# Patient Record
Sex: Male | Born: 1950 | Race: White | Hispanic: No | Smoking: Never smoker
Health system: Southern US, Community
[De-identification: ages and names within clinical notes are randomized; demographics above are authoritative.]

## PROBLEM LIST (undated history)

## (undated) DIAGNOSIS — G250 Essential tremor: Secondary | ICD-10-CM

## (undated) DIAGNOSIS — Z789 Other specified health status: Secondary | ICD-10-CM

## (undated) HISTORY — PX: OTHER SURGICAL HISTORY: SHX169

---

## 2020-03-14 DIAGNOSIS — N529 Male erectile dysfunction, unspecified: Secondary | ICD-10-CM | POA: Diagnosis not present

## 2020-03-14 DIAGNOSIS — E782 Mixed hyperlipidemia: Secondary | ICD-10-CM | POA: Diagnosis not present

## 2020-03-14 DIAGNOSIS — Z79899 Other long term (current) drug therapy: Secondary | ICD-10-CM | POA: Diagnosis not present

## 2020-03-14 DIAGNOSIS — R251 Tremor, unspecified: Secondary | ICD-10-CM | POA: Diagnosis not present

## 2020-03-22 DIAGNOSIS — Z808 Family history of malignant neoplasm of other organs or systems: Secondary | ICD-10-CM | POA: Diagnosis not present

## 2020-03-22 DIAGNOSIS — N529 Male erectile dysfunction, unspecified: Secondary | ICD-10-CM | POA: Diagnosis not present

## 2020-03-22 DIAGNOSIS — Z1331 Encounter for screening for depression: Secondary | ICD-10-CM | POA: Diagnosis not present

## 2020-03-22 DIAGNOSIS — B351 Tinea unguium: Secondary | ICD-10-CM | POA: Diagnosis not present

## 2020-03-22 DIAGNOSIS — Z6830 Body mass index (BMI) 30.0-30.9, adult: Secondary | ICD-10-CM | POA: Diagnosis not present

## 2020-03-22 DIAGNOSIS — Z1211 Encounter for screening for malignant neoplasm of colon: Secondary | ICD-10-CM | POA: Diagnosis not present

## 2020-03-22 DIAGNOSIS — Z0000 Encounter for general adult medical examination without abnormal findings: Secondary | ICD-10-CM | POA: Diagnosis not present

## 2020-03-22 DIAGNOSIS — E782 Mixed hyperlipidemia: Secondary | ICD-10-CM | POA: Diagnosis not present

## 2020-03-22 DIAGNOSIS — R251 Tremor, unspecified: Secondary | ICD-10-CM | POA: Diagnosis not present

## 2020-04-15 DIAGNOSIS — J019 Acute sinusitis, unspecified: Secondary | ICD-10-CM | POA: Diagnosis not present

## 2020-04-15 DIAGNOSIS — B9689 Other specified bacterial agents as the cause of diseases classified elsewhere: Secondary | ICD-10-CM | POA: Diagnosis not present

## 2020-04-15 DIAGNOSIS — J029 Acute pharyngitis, unspecified: Secondary | ICD-10-CM | POA: Diagnosis not present

## 2020-04-15 DIAGNOSIS — Z03818 Encounter for observation for suspected exposure to other biological agents ruled out: Secondary | ICD-10-CM | POA: Diagnosis not present

## 2020-04-21 DIAGNOSIS — D2339 Other benign neoplasm of skin of other parts of face: Secondary | ICD-10-CM | POA: Diagnosis not present

## 2020-04-21 DIAGNOSIS — L821 Other seborrheic keratosis: Secondary | ICD-10-CM | POA: Diagnosis not present

## 2020-04-21 DIAGNOSIS — B351 Tinea unguium: Secondary | ICD-10-CM | POA: Diagnosis not present

## 2020-04-21 DIAGNOSIS — B353 Tinea pedis: Secondary | ICD-10-CM | POA: Diagnosis not present

## 2020-04-25 DIAGNOSIS — R05 Cough: Secondary | ICD-10-CM | POA: Diagnosis not present

## 2020-05-05 ENCOUNTER — Emergency Department: Payer: Medicare HMO

## 2020-05-05 ENCOUNTER — Inpatient Hospital Stay: Payer: Medicare HMO

## 2020-05-05 ENCOUNTER — Encounter: Payer: Self-pay | Admitting: Emergency Medicine

## 2020-05-05 ENCOUNTER — Inpatient Hospital Stay
Admission: EM | Admit: 2020-05-05 | Discharge: 2020-05-11 | DRG: 987 | Disposition: A | Discharge status: Home Health | Payer: Medicare HMO | Attending: Family Medicine | Admitting: Family Medicine

## 2020-05-05 ENCOUNTER — Other Ambulatory Visit: Payer: Self-pay

## 2020-05-05 DIAGNOSIS — S5011XA Contusion of right forearm, initial encounter: Secondary | ICD-10-CM | POA: Diagnosis not present

## 2020-05-05 DIAGNOSIS — R0902 Hypoxemia: Secondary | ICD-10-CM | POA: Diagnosis not present

## 2020-05-05 DIAGNOSIS — G250 Essential tremor: Secondary | ICD-10-CM | POA: Diagnosis present

## 2020-05-05 DIAGNOSIS — W1839XA Other fall on same level, initial encounter: Secondary | ICD-10-CM | POA: Diagnosis present

## 2020-05-05 DIAGNOSIS — R55 Syncope and collapse: Secondary | ICD-10-CM

## 2020-05-05 DIAGNOSIS — R001 Bradycardia, unspecified: Secondary | ICD-10-CM | POA: Diagnosis not present

## 2020-05-05 DIAGNOSIS — Z23 Encounter for immunization: Secondary | ICD-10-CM

## 2020-05-05 DIAGNOSIS — W19XXXA Unspecified fall, initial encounter: Secondary | ICD-10-CM | POA: Diagnosis not present

## 2020-05-05 DIAGNOSIS — R7989 Other specified abnormal findings of blood chemistry: Secondary | ICD-10-CM

## 2020-05-05 DIAGNOSIS — R531 Weakness: Secondary | ICD-10-CM | POA: Diagnosis not present

## 2020-05-05 DIAGNOSIS — J189 Pneumonia, unspecified organism: Secondary | ICD-10-CM | POA: Diagnosis not present

## 2020-05-05 DIAGNOSIS — J984 Other disorders of lung: Secondary | ICD-10-CM | POA: Diagnosis not present

## 2020-05-05 DIAGNOSIS — U071 COVID-19: Principal | ICD-10-CM

## 2020-05-05 DIAGNOSIS — Z801 Family history of malignant neoplasm of trachea, bronchus and lung: Secondary | ICD-10-CM | POA: Diagnosis not present

## 2020-05-05 DIAGNOSIS — Z043 Encounter for examination and observation following other accident: Secondary | ICD-10-CM | POA: Diagnosis not present

## 2020-05-05 DIAGNOSIS — S51812A Laceration without foreign body of left forearm, initial encounter: Secondary | ICD-10-CM

## 2020-05-05 DIAGNOSIS — Y92012 Bathroom of single-family (private) house as the place of occurrence of the external cause: Secondary | ICD-10-CM | POA: Diagnosis not present

## 2020-05-05 DIAGNOSIS — Y9389 Activity, other specified: Secondary | ICD-10-CM | POA: Diagnosis not present

## 2020-05-05 DIAGNOSIS — J9601 Acute respiratory failure with hypoxia: Secondary | ICD-10-CM | POA: Diagnosis not present

## 2020-05-05 DIAGNOSIS — R5383 Other fatigue: Secondary | ICD-10-CM | POA: Diagnosis not present

## 2020-05-05 DIAGNOSIS — J1282 Pneumonia due to coronavirus disease 2019: Secondary | ICD-10-CM | POA: Diagnosis present

## 2020-05-05 DIAGNOSIS — S41012A Laceration without foreign body of left shoulder, initial encounter: Secondary | ICD-10-CM | POA: Diagnosis not present

## 2020-05-05 DIAGNOSIS — R059 Cough, unspecified: Secondary | ICD-10-CM | POA: Diagnosis not present

## 2020-05-05 HISTORY — DX: Other specified health status: Z78.9

## 2020-05-05 HISTORY — DX: Essential tremor: G25.0

## 2020-05-05 LAB — BRAIN NATRIURETIC PEPTIDE: B Natriuretic Peptide: 92 pg/mL (ref 0.0–100.0)

## 2020-05-05 LAB — CBC
HCT: 47.7 % (ref 39.0–52.0)
Hemoglobin: 15.9 g/dL (ref 13.0–17.0)
MCH: 30.4 pg (ref 26.0–34.0)
MCHC: 33.3 g/dL (ref 30.0–36.0)
MCV: 91.2 fL (ref 80.0–100.0)
Platelets: 211 10*3/uL (ref 150–400)
RBC: 5.23 MIL/uL (ref 4.22–5.81)
RDW: 12.2 % (ref 11.5–15.5)
WBC: 9.7 10*3/uL (ref 4.0–10.5)
nRBC: 0 % (ref 0.0–0.2)

## 2020-05-05 LAB — LACTATE DEHYDROGENASE: LDH: 312 U/L — ABNORMAL HIGH (ref 98–192)

## 2020-05-05 LAB — RESPIRATORY PANEL BY RT PCR (FLU A&B, COVID)
Influenza A by PCR: NEGATIVE
Influenza B by PCR: NEGATIVE
SARS Coronavirus 2 by RT PCR: POSITIVE — AB

## 2020-05-05 LAB — COMPREHENSIVE METABOLIC PANEL
ALT: 40 U/L (ref 0–44)
AST: 46 U/L — ABNORMAL HIGH (ref 15–41)
Albumin: 3.2 g/dL — ABNORMAL LOW (ref 3.5–5.0)
Alkaline Phosphatase: 49 U/L (ref 38–126)
Anion gap: 13 (ref 5–15)
BUN: 28 mg/dL — ABNORMAL HIGH (ref 8–23)
CO2: 24 mmol/L (ref 22–32)
Calcium: 8.5 mg/dL — ABNORMAL LOW (ref 8.9–10.3)
Chloride: 98 mmol/L (ref 98–111)
Creatinine, Ser: 1.07 mg/dL (ref 0.61–1.24)
GFR calc non Af Amer: >60 mL/min (ref >60)
Glucose, Bld: 114 mg/dL — ABNORMAL HIGH (ref 70–99)
Potassium: 3.9 mmol/L (ref 3.5–5.1)
Sodium: 135 mmol/L (ref 135–145)
Total Bilirubin: 1.4 mg/dL — ABNORMAL HIGH (ref 0.3–1.2)
Total Protein: 7.1 g/dL (ref 6.5–8.1)

## 2020-05-05 LAB — FIBRIN DERIVATIVES D-DIMER (ARMC ONLY): Fibrin derivatives D-dimer (ARMC): 3204.94 ng/mL (FEU) — ABNORMAL HIGH (ref 0.00–499.00)

## 2020-05-05 LAB — PROCALCITONIN: Procalcitonin: 0.17 ng/mL

## 2020-05-05 LAB — FIBRINOGEN: Fibrinogen: >750 mg/dL — ABNORMAL HIGH (ref 210–475)

## 2020-05-05 LAB — HIV ANTIBODY (ROUTINE TESTING W REFLEX): HIV Screen 4th Generation wRfx: NONREACTIVE

## 2020-05-05 LAB — TROPONIN I (HIGH SENSITIVITY): Troponin I (High Sensitivity): 8 ng/L (ref <18)

## 2020-05-05 LAB — MAGNESIUM: Magnesium: 2.7 mg/dL — ABNORMAL HIGH (ref 1.7–2.4)

## 2020-05-05 LAB — C-REACTIVE PROTEIN: CRP: 12.8 mg/dL — ABNORMAL HIGH (ref <1.0)

## 2020-05-05 LAB — FERRITIN: Ferritin: 2086 ng/mL — ABNORMAL HIGH (ref 24–336)

## 2020-05-05 LAB — HEPATITIS B SURFACE ANTIGEN: Hepatitis B Surface Ag: NONREACTIVE

## 2020-05-05 LAB — TRIGLYCERIDES: Triglycerides: 95 mg/dL (ref <150)

## 2020-05-05 MED ORDER — ONDANSETRON HCL 4 MG/2ML IJ SOLN: 4.0000 mg | Freq: Once | INTRAMUSCULAR | Status: DC

## 2020-05-05 MED ORDER — LOPERAMIDE HCL 2 MG PO CAPS
2.0000 mg | ORAL_CAPSULE | Freq: Two times a day (BID) | ORAL | Status: DC | PRN
  Filled 2020-05-05: qty 1

## 2020-05-05 MED ORDER — SODIUM CHLORIDE 0.9 % IV SOLN
200.0000 mg | Freq: Once | INTRAVENOUS | Status: AC
  Administered 2020-05-05: 14:00:00 200 mg via INTRAVENOUS
  Filled 2020-05-05: qty 200

## 2020-05-05 MED ORDER — IPRATROPIUM BROMIDE HFA 17 MCG/ACT IN AERS
2.0000 | INHALATION_SPRAY | RESPIRATORY_TRACT | Status: DC
  Administered 2020-05-05 – 2020-05-11 (×34): 2 via RESPIRATORY_TRACT
  Filled 2020-05-05 (×3): qty 12.9

## 2020-05-05 MED ORDER — ALBUTEROL SULFATE HFA 108 (90 BASE) MCG/ACT IN AERS
2.0000 | INHALATION_SPRAY | RESPIRATORY_TRACT | Status: DC | PRN
  Filled 2020-05-05: qty 6.7

## 2020-05-05 MED ORDER — DEXAMETHASONE SODIUM PHOSPHATE 10 MG/ML IJ SOLN
6.0000 mg | Freq: Once | INTRAMUSCULAR | Status: AC
  Administered 2020-05-05: 6 mg via INTRAVENOUS
  Filled 2020-05-05: qty 1

## 2020-05-05 MED ORDER — IOHEXOL 350 MG/ML SOLN
75.0000 mL | Freq: Once | INTRAVENOUS | Status: AC | PRN
  Administered 2020-05-05: 13:00:00 75 mL via INTRAVENOUS

## 2020-05-05 MED ORDER — TETANUS-DIPHTH-ACELL PERTUSSIS 5-2.5-18.5 LF-MCG/0.5 IM SUSP
0.5000 mL | Freq: Once | INTRAMUSCULAR | Status: AC
  Administered 2020-05-05: 0.5 mL via INTRAMUSCULAR
  Filled 2020-05-05: qty 0.5

## 2020-05-05 MED ORDER — HYDROMORPHONE HCL 1 MG/ML IJ SOLN: 0.5000 mg | Freq: Once | INTRAMUSCULAR | Status: DC

## 2020-05-05 MED ORDER — ENOXAPARIN SODIUM 40 MG/0.4ML ~~LOC~~ SOLN
40.0000 mg | SUBCUTANEOUS | Status: DC
  Administered 2020-05-05 – 2020-05-10 (×6): 40 mg via SUBCUTANEOUS
  Filled 2020-05-05 (×6): qty 0.4

## 2020-05-05 MED ORDER — SODIUM CHLORIDE 0.9 % IV SOLN
100.0000 mg | Freq: Every day | INTRAVENOUS | Status: AC
  Administered 2020-05-06 – 2020-05-09 (×4): 100 mg via INTRAVENOUS
  Filled 2020-05-05 (×4): qty 20

## 2020-05-05 MED ORDER — DM-GUAIFENESIN ER 30-600 MG PO TB12
1.0000 | ORAL_TABLET | Freq: Two times a day (BID) | ORAL | Status: DC | PRN
  Administered 2020-05-07: 1 via ORAL
  Filled 2020-05-05: qty 1

## 2020-05-05 MED ORDER — ASCORBIC ACID 500 MG PO TABS
500.0000 mg | ORAL_TABLET | Freq: Every day | ORAL | Status: DC
  Administered 2020-05-05 – 2020-05-11 (×7): 500 mg via ORAL
  Filled 2020-05-05 (×7): qty 1

## 2020-05-05 MED ORDER — ONDANSETRON HCL 4 MG PO TABS: 4.0000 mg | ORAL_TABLET | Freq: Four times a day (QID) | ORAL | Status: DC | PRN

## 2020-05-05 MED ORDER — LACTATED RINGERS IV BOLUS: 1000.0000 mL | Freq: Once | INTRAVENOUS | Status: DC

## 2020-05-05 MED ORDER — ACETAMINOPHEN 325 MG PO TABS: 650.0000 mg | ORAL_TABLET | Freq: Four times a day (QID) | ORAL | Status: DC | PRN

## 2020-05-05 MED ORDER — METHYLPREDNISOLONE SODIUM SUCC 40 MG IJ SOLR
40.0000 mg | Freq: Two times a day (BID) | INTRAMUSCULAR | Status: DC
  Administered 2020-05-05 – 2020-05-11 (×12): 40 mg via INTRAVENOUS
  Filled 2020-05-05 (×12): qty 1

## 2020-05-05 MED ORDER — PROPRANOLOL HCL ER 60 MG PO CP24
60.0000 mg | ORAL_CAPSULE | Freq: Every morning | ORAL | Status: DC
  Administered 2020-05-06 – 2020-05-11 (×4): 60 mg via ORAL
  Filled 2020-05-05 (×7): qty 1

## 2020-05-05 MED ORDER — ZINC SULFATE 220 (50 ZN) MG PO CAPS
220.0000 mg | ORAL_CAPSULE | Freq: Every day | ORAL | Status: DC
  Administered 2020-05-05 – 2020-05-11 (×7): 220 mg via ORAL
  Filled 2020-05-05 (×7): qty 1

## 2020-05-05 MED ORDER — LIDOCAINE-EPINEPHRINE 2 %-1:100000 IJ SOLN
20.0000 mL | Freq: Once | INTRAMUSCULAR | Status: AC
  Administered 2020-05-05: 20 mL via INTRADERMAL
  Filled 2020-05-05: qty 1

## 2020-05-05 NOTE — ED Notes (Signed)
Pt transported to CT ?

## 2020-05-05 NOTE — Progress Notes (Signed)
Remdesivir - Pharmacy Brief Note   O:  ALT: 40 CXR: Patchy bilateral basilar predominant airspace disease compatible with multifocal pneumonia. SpO2: 93-98% on 4 L   A/P:  Remdesivir 200 mg IVPB once followed by 100 mg IVPB daily x 4 days.   Pernell Dupre, PharmD, BCPS Clinical Pharmacist 05/05/2020 11:46 AM

## 2020-05-05 NOTE — Progress Notes (Signed)
Orthostatic VS:  Lying: 154/95, pulse 69 Sitting: 247/92, pulse 75 Standing: 149/133, pulse 80

## 2020-05-05 NOTE — Progress Notes (Signed)
Ultrasound at bedside

## 2020-05-05 NOTE — ED Provider Notes (Signed)
[facility] Emergency Department Provider Note  ____________________________________________   First MD Initiated Contact with Patient 05/05/20 1023     (approximate)  I have reviewed the triage vital signs and the nursing notes.   HISTORY  Chief Complaint Fatigue and Loss of Consciousness   HPI Joseph Parsons is a 69 y.o. male without significant past medical history presents from home for assessment after a syncopal episode.  Patient notes he was diagnosed with pneumonia 3-4 weeks ago and finished antibiotics but still has a productive cough and was recently diagnosed with Covid 1 week ago.  Patient endorses significant fatigue and some intermittent diarrhea as well as a syncopal episode yesterday during which she stated he fell forward striking his toilet with both arms.  He does not think he hit his head and does not have a headache, neck pain, back pain, dental pain, chest pain, or extremity pain with exception of his bilateral forearms.  He notes he has a cut on his left forearm and some bruising on his right forearm.  He has not syncopized today.  He denies being on blood thinners.  He denies any significant past medical history including tobacco abuse, EtOH use, illicit drug use.         Past Medical History:  Diagnosis Date  . Patient denies medical problems     Patient Active Problem List   Diagnosis Date Noted  . Pneumonia due to COVID-19 virus 05/05/2020  . Syncope 05/05/2020  . Fall 05/05/2020  . Acute respiratory failure with hypoxia ([place]) 05/05/2020    History reviewed. No pertinent surgical history.  Prior to Admission medications   Medication Sig Start Date End Date Taking? Authorizing Provider  propranolol ER (INDERAL LA) 60 MG 24 hr capsule Take 60 mg by mouth every morning. 04/21/20  Yes [provider]  albuterol (VENTOLIN HFA) 108 (90 Base) MCG/ACT inhaler Inhale 2 puffs into the lungs every 4 (four) hours as needed.  04/25/20   [provider]    Allergies Patient has no known allergies.  History reviewed. No pertinent family history.  Social History Social History   Tobacco Use  . Smoking status: Never Smoker  . Smokeless tobacco: Never Used  Substance Use Topics  . Alcohol use: Yes  . Drug use: Never    Review of Systems  Review of Systems  Constitutional: Positive for malaise/fatigue. Negative for chills and fever.  HENT: Negative for sore throat.   Eyes: Negative for pain.  Respiratory: Positive for cough and shortness of breath. Negative for stridor.   Cardiovascular: Negative for chest pain.  Gastrointestinal: Negative for vomiting.  Skin: Negative for rash.  Neurological: Positive for loss of consciousness. Negative for seizures and headaches.  Psychiatric/Behavioral: Negative for suicidal ideas.  All other systems reviewed and are negative.     ____________________________________________   PHYSICAL EXAM:  VITAL SIGNS: ED Triage Vitals  Enc Vitals Group     BP 05/05/20 1008 116/74     Pulse Rate 05/05/20 1008 81     Resp 05/05/20 1008 (!) 22     Temp 05/05/20 1008 98.5 F (36.9 C)     Temp Source 05/05/20 1008 Oral     SpO2 05/05/20 1008 (!) 85 %     Weight 05/05/20 1010 190 lb (86.2 kg)     Height 05/05/20 1010 5\' 10"  (1.778 m)     Head Circumference --      Peak Flow --  Pain Score 05/05/20 1009 0     Pain Loc --      Pain Edu? --      Excl. in [place]? --    Vitals:   05/05/20 1148 05/05/20 1156  BP: 132/79   Pulse: 67   Resp: (!) 22   Temp:    SpO2: 98% 96%   Physical Exam Vitals and nursing note reviewed.  Constitutional:      Appearance: He is well-developed.  HENT:     Head: Normocephalic and atraumatic.     Right Ear: External ear normal.     Left Ear: External ear normal.     Nose: Nose normal.  Eyes:     Conjunctiva/sclera: Conjunctivae normal.  Cardiovascular:     Rate and Rhythm: Normal rate and regular rhythm.     Heart  sounds: No murmur heard.   Pulmonary:     Effort: Pulmonary effort is normal. Tachypnea present. No respiratory distress.     Breath sounds: Normal breath sounds.  Abdominal:     Palpations: Abdomen is soft.     Tenderness: There is no abdominal tenderness.  Musculoskeletal:     Cervical back: Neck supple.  Skin:    General: Skin is warm and dry.  Neurological:     Mental Status: He is alert and oriented to person, place, and time.  Psychiatric:        Mood and Affect: Mood normal.     Patient does have approximately 1 cm circular hemostatic linear laceration over his left forearm.  2+ bilateral radial pulses.  There are some ecchymosis over the bilateral anterior forearms.  Sensation is intact in the distribution of the radial ulnar and median nerves bilaterally.  Patient has full strength range of motion and no tenderness over the bilateral wrist elbow and shoulder joints.  No other obvious trauma to the upper extremities.  No tenderness over the C/T/L-spine. ____________________________________________   LABS (all labs ordered are listed, but only abnormal results are displayed)  Labs Reviewed  RESPIRATORY PANEL BY RT PCR (FLU A&B, COVID) - Abnormal; Notable for the following components:      Result Value   SARS Coronavirus 2 by RT PCR POSITIVE (*)    All other components within normal limits  COMPREHENSIVE METABOLIC PANEL - Abnormal; Notable for the following components:   Glucose, Bld 114 (*)    BUN 28 (*)    Calcium 8.5 (*)    Albumin 3.2 (*)    AST 46 (*)    Total Bilirubin 1.4 (*)    All other components within normal limits  FIBRIN DERIVATIVES D-DIMER ([facility] ONLY) - Abnormal; Notable for the following components:   Fibrin derivatives D-dimer ([facility]) 3,204.94 (*)    All other components within normal limits  MAGNESIUM - Abnormal; Notable for the following components:   Magnesium 2.7 (*)    All other components within normal limits  CBC  BRAIN NATRIURETIC PEPTIDE   URINALYSIS, COMPLETE (UACMP) WITH MICROSCOPIC  PROCALCITONIN  CBG MONITORING, ED  TROPONIN I (HIGH SENSITIVITY)  TROPONIN I (HIGH SENSITIVITY)   ____________________________________________  EKG  Sinus rhythm with a ventricular rate of 85, normal axis, unremarkable intervals, nonspecific ST change in lead III and no other clear evidence of acute ischemia. ____________________________________________  RADIOLOGY  ED MD interpretation: Bilateral infiltrates consistent with multifocal pneumonia.  No significant edema or effusion or pneumothorax.  Official radiology report(s): DG Chest 2 View  Result Date: 05/05/2020 CLINICAL DATA:  Productive cough. EXAM: CHEST -  2 VIEW COMPARISON:  [facility]. FINDINGS: Interval development of patchy basilar predominant airspace disease, left greater than right. No pleural effusion. Cardiopericardial silhouette is at upper limits of normal for size. The visualized bony structures of the thorax show no acute abnormality. IMPRESSION: Patchy bilateral basilar predominant airspace disease compatible with multifocal pneumonia. Electronically Signed   By: Misty Stanley M.D.   On: 05/05/2020 10:46    ____________________________________________   PROCEDURES  Procedure(s) performed (including Critical Care):  Marland KitchenMarland KitchenLaceration Repair  Date/Time: 05/05/2020 12:15 PM Performed by: Lucrezia Starch, MD Authorized by: Lucrezia Starch, MD   Consent:    Consent obtained:  Verbal   Consent given by:  Patient   Risks discussed:  Pain, need for additional repair and poor wound healing   Alternatives discussed:  No treatment Anesthesia (see MAR for exact dosages):    Anesthesia method:  Local infiltration   Local anesthetic:  Lidocaine 2% WITH epi Laceration details:    Location:  Shoulder/arm   Shoulder/arm location:  L lower arm   Length (cm):  1 Repair type:    Repair type:  Intermediate Exploration:     Contaminated: no   Treatment:    Area cleansed with:  Saline   Amount of cleaning:  Standard   Irrigation solution:  Sterile water   Irrigation method:  Syringe Skin repair:    Repair method:  Sutures   Suture size:  6-0   Suture material:  Prolene   Number of sutures:  6 Approximation:    Approximation:  Close Post-procedure details:    Patient tolerance of procedure:  Tolerated well, no immediate complications .Critical Care Performed by: Lucrezia Starch, MD Authorized by: Lucrezia Starch, MD   Critical care provider statement:    Critical care time (minutes):  45   Critical care was necessary to treat or prevent imminent or life-threatening deterioration of the following conditions:  Respiratory failure   Critical care was time spent personally by me on the following activities:  Discussions with consultants, evaluation of patient's response to treatment, examination of patient, ordering and performing treatments and interventions, ordering and review of laboratory studies, ordering and review of radiographic studies, pulse oximetry, re-evaluation of patient's condition, obtaining history from patient or surrogate and review of old charts     ____________________________________________   INITIAL IMPRESSION / [place] / ED COURSE        Patient presents with [place] to history exam for assessment of worsening shortness of breath, cough, and a syncopal episode that occurred in the setting of recent COVID-19 diagnosis.  Patient is tachypneic with otherwise stable vital signs on 3 L.  On room air patient's SPO2 is noted to be 85%.  Patient did sustain a laceration to his left forearm when he syncopized yesterday.  This was repaired per procedure note above.  Otherwise impression is contusion and bruising with low suspicion for fracture patient is neurovascular intact distal bilateral upper extremities.  Covid test is positive.  Given elevated dimer will obtain CTA chest  to assess for evidence of PE.  Low suspicion for ACS or myocarditis given reassuring EKG and patient denying any history of chest pain and a troponin of 8.  CMP shows no significant electrolyte or metabolic derangements.  CBC is unremarkable.  BNP is 92 and patient has no findings on exam or chest x-ray to suggest volume overload contributing to his respiratory distress.  I suspect patient's syncopal episode yesterday is  a combination of hypoxic respiratory failure as well as mild dehydration from recent diarrhea.  Decadron and remdesivir ordered.  I will admit to hospital service for further evaluation and management.  ____________________________________________   FINAL CLINICAL IMPRESSION(S) / ED DIAGNOSES  Final diagnoses:  Acute respiratory failure with hypoxia (HCC)  COVID-19  Forearm laceration, left, initial encounter  Syncope and collapse  Positive D dimer    Medications  remdesivir 200 mg in sodium chloride 0.9% 250 mL IVPB (has no administration in time range)    Followed by  remdesivir 100 mg in sodium chloride 0.9 % 100 mL IVPB (has no administration in time range)  methylPREDNISolone sodium succinate (SOLU-MEDROL) 40 mg/mL injection 40 mg (has no administration in time range)  ascorbic acid (VITAMIN C) tablet 500 mg (has no administration in time range)  zinc sulfate capsule 220 mg (has no administration in time range)  ipratropium (ATROVENT HFA) inhaler 2 puff (has no administration in time range)  albuterol (VENTOLIN HFA) 108 (90 Base) MCG/ACT inhaler 2 puff (has no administration in time range)  dextromethorphan-guaiFENesin (MUCINEX DM) 30-600 MG per 12 hr tablet 1 tablet (has no administration in time range)  ondansetron (ZOFRAN) tablet 4 mg (has no administration in time range)  acetaminophen (TYLENOL) tablet 650 mg (has no administration in time range)  Tdap (BOOSTRIX) injection 0.5 mL (0.5 mLs Intramuscular Given 05/05/20 1111)  lidocaine-EPINEPHrine (XYLOCAINE  W/EPI) 2 %-1:100000 (with pres) injection 20 mL (20 mLs Intradermal Given 05/05/20 1111)  dexamethasone (DECADRON) injection 6 mg (6 mg Intravenous Given 05/05/20 1144)     ED Discharge Orders    None       Note:  This document was prepared using Dragon voice recognition software and may include unintentional dictation errors.   Lucrezia Starch, MD 05/05/20 [phone]

## 2020-05-05 NOTE — ED Triage Notes (Signed)
Pt here for weakness and fatigue.  Has had productive cough. Had PNA in September and finished abx but did not feel better so went to walk in clinic one week ago and dx with covid.  Unlabored but mild tachypnea. Hypoxia in triage.

## 2020-05-05 NOTE — ED Notes (Signed)
Pt transported to CT at this time.

## 2020-05-05 NOTE — ED Notes (Signed)
Date and time results received: 05/05/20 12:07  Test: COVID Critical Value: positive result  Name of Provider Notified: Creig Hines, MD  Orders Received? Or Actions Taken?: Orders placed accordingly to results.

## 2020-05-05 NOTE — ED Triage Notes (Signed)
Pt in via EMS from home with c/o syncopal episode. Pt reports tested positive also. Pt fell and hit forearm on his forearm.  Pt was dx'd with pneumonia 2 weeks ago. Pt reports sx's no better. Pt with productive cough.

## 2020-05-05 NOTE — H&P (Signed)
History and Physical    Joseph Parsons XKG:[id number] DOB: 06-Aug-1950 DOA: 05/05/2020  Referring MD/NP/PA:   PCP: [facility]   Patient coming from:  The patient is coming from home.  At baseline, pt is independent for most of ADL.        Chief Complaint: Cough, shortness breath, syncope  HPI: Joseph Parsons is a 69 y.o. male with medical history significant of essential tremor, who presents with cough, shortness breath, syncope.  Pt states that he has been having cough and shortness breath for several weeks.  He was recently diagnosed with pneumonia 3 weeks ago and finished a course of antibiotics.  He states that he continues to have cough and shortness of breath.  He was tested positive for Covid19 one week ago.  Patient denies fever or chills.  No chest pain.  Patient has intermittent diarrhea, denies nausea, vomiting, abdominal pain.  No symptoms of UTI.  Patient states that he was fully vaccinated against COVID-19. Patient states that he has been feeling weak. He has lack of energy and poor appetite. Patient states that he passed out yesterday. He states that he fell forward striking his toilet with both arms. He notes bruising on his right forearm. Patient denies any head or neck injury.  ED Course: pt was found to have positive Covid PCR, D-dimer 3204, WBC 9.7, BNP 92, electrolytes renal function okay, troponin level 8, temperature normal, blood pressure 116/74, heart rate 81, RR 22, oxygen desaturated to 85% on room air, which improved to 93 on 3 L oxygen.  Chest x-ray showed bilateral patchy infiltration. CT angiogram negative for PE.  Lower extremity Doppler negative for DVT. Patient is admitted to [place] bed as inpatient.   Review of Systems:   General: no fevers, chills, no body weight gain, has poor appetite, has fatigue HEENT: no blurry vision, hearing changes or sore throat Respiratory: has dyspnea, coughing, no wheezing CV: no chest pain, no  palpitations GI: no nausea, vomiting, abdominal pain, has diarrhea, no constipation GU: no dysuria, burning on urination, increased urinary frequency, hematuria  Ext: no leg edema Neuro: no unilateral weakness, numbness, or tingling, no vision change or hearing loss. Has syncope. Skin: no rash. MSK: No muscle spasm, no deformity, no limitation of range of movement in spin Heme: No easy bruising.  Travel history: No recent long distant travel.  Allergy: No Known Allergies  Past Medical History:  Diagnosis Date  . Essential tremor   . Patient denies medical problems     Past Surgical History:  Procedure Laterality Date  . right knee surgery      Social History:  reports that he has never smoked. He has never used smokeless tobacco. He reports current alcohol use. He reports that he does not use drugs.  Family History:  Family History  Problem Relation Age of Onset  . Lung cancer Sister      Prior to Admission medications   Not on File    Physical Exam: Vitals:   05/05/20 1340 05/05/20 1432 05/05/20 1440 05/05/20 1640  BP: 115/76   (!) 141/83  Pulse: 69   73  Resp: 20   18  Temp: 98.1 F (36.7 C)   98.6 F (37 C)  TempSrc: Oral   Oral  SpO2: 98% (!) 86% 94% 93%  Weight:      Height:       General: Not in acute distress HEENT:       Eyes: PERRL,  EOMI, no scleral icterus.       ENT: No discharge from the ears and nose, no pharynx injection, no tonsillar enlargement.        Neck: No JVD, no bruit, no mass felt. Heme: No neck lymph node enlargement. Cardiac: S1/S2, RRR, No murmurs, No gallops or rubs. Respiratory: Has coarse breathing sound bialterally GI: Soft, nondistended, nontender, no rebound pain, no organomegaly, BS present. GU: No hematuria Ext: No pitting leg edema bilaterally. 2+DP/PT pulse bilaterally. Musculoskeletal: No joint deformities, No joint redness or warmth, no limitation of ROM in spin. Skin: No rashes.  Neuro: Alert, answers questions  slowly, but patient is oriented X3, cranial nerves II-XII grossly intact, moves all extremities normally Psych: Patient is not psychotic, no suicidal or hemocidal ideation.  Labs on Admission: I have personally reviewed following labs and imaging studies  CBC: Recent Labs  Lab 05/05/20 1013  WBC 9.7  HGB 15.9  HCT 47.7  MCV 91.2  PLT 947   Basic Metabolic Panel: Recent Labs  Lab 05/05/20 1013 05/05/20 1021  NA 135  --   K 3.9  --   CL 98  --   CO2 24  --   GLUCOSE 114*  --   BUN 28*  --   CREATININE 1.07  --   CALCIUM 8.5*  --   MG  --  2.7*   GFR: Estimated Creatinine Clearance: 68.2 mL/min (by C-G formula based on SCr of 1.07 mg/dL). Liver Function Tests: Recent Labs  Lab 05/05/20 1013  AST 46*  ALT 40  ALKPHOS 49  BILITOT 1.4*  PROT 7.1  ALBUMIN 3.2*   No results for input(s): LIPASE, AMYLASE in the last 168 hours. No results for input(s): AMMONIA in the last 168 hours. Coagulation Profile: No results for input(s): INR, PROTIME in the last 168 hours. Cardiac Enzymes: No results for input(s): CKTOTAL, CKMB, CKMBINDEX, TROPONINI in the last 168 hours. BNP (last 3 results) No results for input(s): PROBNP in the last 8760 hours. HbA1C: No results for input(s): HGBA1C in the last 72 hours. CBG: No results for input(s): GLUCAP in the last 168 hours. Lipid Profile: Recent Labs    05/05/20 1423  TRIG 95   Thyroid Function Tests: No results for input(s): TSH, T4TOTAL, FREET4, T3FREE, THYROIDAB in the last 72 hours. Anemia Panel: Recent Labs    05/05/20 1423  FERRITIN 2,086*   Urine analysis: No results found for: COLORURINE, APPEARANCEUR, LABSPEC, PHURINE, GLUCOSEU, HGBUR, BILIRUBINUR, KETONESUR, PROTEINUR, UROBILINOGEN, NITRITE, LEUKOCYTESUR Sepsis Labs: @LABRCNTIP (procalcitonin:4,lacticidven:4) ) Recent Results (from the past 240 hour(s))  Respiratory Panel by RT PCR (Flu A&B, Covid) - Nasopharyngeal Swab     Status: Abnormal   Collection Time:  05/05/20 11:09 AM   Specimen: Nasopharyngeal Swab  Result Value Ref Range Status   SARS Coronavirus 2 by RT PCR POSITIVE (A) NEGATIVE Final    Comment: RESULT CALLED TO, READ BACK BY AND VERIFIED WITH: KENDALL Polcyn 05/05/20 1208 KLW (NOTE) SARS-CoV-2 target nucleic acids are DETECTED.  SARS-CoV-2 RNA is generally detectable in upper respiratory specimens  during the acute phase of infection. Positive results are indicative of the presence of the identified virus, but do not rule out bacterial infection or co-infection with other pathogens not detected by the test. Clinical correlation with patient history and other diagnostic information is necessary to determine patient infection status. The expected result is Negative.  Fact Sheet for Patients:  PinkCheek.be  Fact Sheet for Healthcare Providers: GravelBags.it  This test is not yet  approved or cleared by the [place] and  has been authorized for detection and/or diagnosis of SARS-CoV-2 by FDA under an Emergency Use Authorization (EUA).  This EUA will remain in effect (meaning this test can be u sed) for the duration of  the COVID-19 declaration under Section 564(b)(1) of the Act, 21 U.S.C. section 360bbb-3(b)(1), unless the authorization is terminated or revoked sooner.      Influenza A by PCR NEGATIVE NEGATIVE Final   Influenza B by PCR NEGATIVE NEGATIVE Final    Comment: (NOTE) The Xpert Xpress SARS-CoV-2/FLU/RSV assay is intended as an aid in  the diagnosis of influenza from Nasopharyngeal swab specimens and  should not be used as a sole basis for treatment. Nasal washings and  aspirates are unacceptable for Xpert Xpress SARS-CoV-2/FLU/RSV  testing.  Fact Sheet for Patients: PinkCheek.be  Fact Sheet for Healthcare Providers: GravelBags.it  This test is not yet approved or cleared by the  [place] FDA and  has been authorized for detection and/or diagnosis of SARS-CoV-2 by  FDA under an Emergency Use Authorization (EUA). This EUA will remain  in effect (meaning this test can be used) for the duration of the  Covid-19 declaration under Section 564(b)(1) of the Act, 21  U.S.C. section 360bbb-3(b)(1), unless the authorization is  terminated or revoked. Performed at [facility], [address] [place], [place] [address]      Radiological Exams on Admission: DG Chest 2 View  Result Date: 05/05/2020 CLINICAL DATA:  Productive cough. EXAM: CHEST - 2 VIEW COMPARISON:  [facility]. FINDINGS: Interval development of patchy basilar predominant airspace disease, left greater than right. No pleural effusion. Cardiopericardial silhouette is at upper limits of normal for size. The visualized bony structures of the thorax show no acute abnormality. IMPRESSION: Patchy bilateral basilar predominant airspace disease compatible with multifocal pneumonia. Electronically Signed   By: Misty Stanley M.D.   On: 05/05/2020 10:46   CT HEAD WO CONTRAST  Result Date: 05/05/2020 CLINICAL DATA:  69 year old male with weakness, fatigue, productive cough, COVID-19. EXAM: CT HEAD WITHOUT CONTRAST TECHNIQUE: Contiguous axial images were obtained from the base of the skull through the vertex without intravenous contrast. COMPARISON:  None. FINDINGS: Brain: Cerebral volume is within normal limits for age. No midline shift, mass effect, or evidence of intracranial mass lesion. No ventriculomegaly. No acute intracranial hemorrhage identified. 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 Perivascular spaces less likely. Largely normal for age gray-white matter differentiation elsewhere. No acute cortical infarct or chronic cortical encephalomalacia identified. Vascular: Mild Calcified atherosclerosis at the skull base. No suspicious  intracranial vascular hyperdensity. Skull: No acute osseous abnormality identified. Sinuses/Orbits: Small fluid level with bubbly opacity in the right maxillary sinus. Minor ethmoid mucosal thickening. Other sinuses, the tympanic cavities and mastoids are well pneumatized. Other: No acute orbit or scalp soft tissue findings. Postoperative changes to the left globe. IMPRESSION: 1. No acute intracranial abnormality. 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 2. Acute right maxillary sinus inflammation. Electronically Signed   By: Genevie Ann M.D.   On: 05/05/2020 13:28   CT Angio Chest PE W and/or Wo Contrast  Result Date: 05/05/2020 CLINICAL DATA:  69 year old male with weakness, fatigue, productive cough. Treated for pneumonia in September with antibiotics. Subsequently diagnosed with COVID-19. EXAM: CT ANGIOGRAPHY CHEST WITH CONTRAST TECHNIQUE: Multidetector CT imaging of the chest was performed using the standard protocol during bolus administration of intravenous contrast. Multiplanar CT  image reconstructions and MIPs were obtained to evaluate the vascular anatomy. CONTRAST:  59mL OMNIPAQUE IOHEXOL 350 MG/ML SOLN COMPARISON:  Chest radiographs earlier today. FINDINGS: Cardiovascular: Good contrast bolus timing in the pulmonary arterial tree. No focal filling defect identified in the pulmonary arteries to suggest acute pulmonary embolism. Cardiac size at the upper limits of normal.  Negative visible aorta. Mediastinum/Nodes: Negative; upper limits of normal right carina and subcarinal lymph nodes. Lungs/Pleura: Bilateral lower lobe consolidation involving the posterior basal segments. Additional streaky opacity in the other lower lobe segments. Peripheral and peribronchial mostly ground-glass opacity in both upper lobes. Major airways remain patent. No pleural effusion. Upper Abdomen: Negative visible liver, gallbladder, spleen, pancreas, adrenal glands, kidneys, and bowel in the upper  abdomen. Musculoskeletal: Degenerative vertebral endplate and costovertebral spurring at multiple levels. No acute or suspicious osseous lesion. Review of the MIP images confirms the above findings. IMPRESSION: 1. Negative for acute pulmonary embolus. 2. Widespread bilateral pneumonia typical in appearance for COVID-19. Some consolidation in both lower lobes. No pleural effusion. Electronically Signed   By: Genevie Ann M.D.   On: 05/05/2020 13:25   US Venous Img Lower Bilateral (DVT)  Result Date: 05/05/2020 CLINICAL DATA:  Positive D-dimer study.  Recent fall EXAM: BILATERAL LOWER EXTREMITY VENOUS DUPLEX ULTRASOUND TECHNIQUE: Gray-scale sonography with graded compression, as well as color Doppler and duplex ultrasound were performed to evaluate the lower extremity deep venous systems from the level of the common femoral vein and including the common femoral, femoral, profunda femoral, popliteal and calf veins including the posterior tibial, peroneal and gastrocnemius veins when visible. The superficial great saphenous vein was also interrogated. Spectral Doppler was utilized to evaluate flow at rest and with distal augmentation maneuvers in the common femoral, femoral and popliteal veins. COMPARISON:  None. FINDINGS: RIGHT LOWER EXTREMITY Common Femoral Vein: No evidence of thrombus. Normal compressibility, respiratory phasicity and response to augmentation. Saphenofemoral Junction: No evidence of thrombus. Normal compressibility and flow on color Doppler imaging. Profunda Femoral Vein: No evidence of thrombus. Normal compressibility and flow on color Doppler imaging. Femoral Vein: No evidence of thrombus. Normal compressibility, respiratory phasicity and response to augmentation. Popliteal Vein: No evidence of thrombus. Normal compressibility, respiratory phasicity and response to augmentation. Calf Veins: No evidence of thrombus. Normal compressibility and flow on color Doppler imaging. Superficial Great  Saphenous Vein: No evidence of thrombus. Normal compressibility. Venous Reflux:  None. Other Findings:  Blood flow has a rouleaux type pattern. LEFT LOWER EXTREMITY Common Femoral Vein: No evidence of thrombus. Normal compressibility, respiratory phasicity and response to augmentation. Saphenofemoral Junction: No evidence of thrombus. Normal compressibility and flow on color Doppler imaging. Profunda Femoral Vein: No evidence of thrombus. Normal compressibility and flow on color Doppler imaging. Femoral Vein: No evidence of thrombus. Normal compressibility, respiratory phasicity and response to augmentation. Popliteal Vein: No evidence of thrombus. Normal compressibility, respiratory phasicity and response to augmentation. Calf Veins: No evidence of thrombus. Normal compressibility and flow on color Doppler imaging. Superficial Great Saphenous Vein: No evidence of thrombus. Normal compressibility. Venous Reflux:  None. Other Findings:  Blood flow as a rouleaux type pattern. IMPRESSION: No evidence of deep venous thrombosis in either lower extremity. Blood flow has a rouleaux type pattern bilaterally suggesting slow flow. Clinical significance of this finding uncertain. Electronically Signed   By: Lowella Grip III M.D.   On: 05/05/2020 14:44     EKG: Independently reviewed.  Sinus rhythm, QTC 437, LAE, no ischemic change.  Assessment/Plan Principal Problem:  Pneumonia due to COVID-19 virus Active Problems:   Syncope and collapse   Fall   Acute respiratory failure with hypoxia (HCC)   Essential tremor  Acute respiratory failure with hypoxia due to pneumonia due to COVID-19 virus: Oxygen desaturation to 85% on room air, improved to 93 on 3 L nasal cannula oxygen.  Chest x-ray is positive for bilateral patchy infiltration.  CT angiogram negative for PE.  Lower extremity Doppler negative for DVT.  -will admit to med-surg bed as inpt -Remdesivir per pharm -Solumedrol 40 mg bid -vitamin C, zinc.   -Bronchodilators -PRN Mucinex for cough -f/u Blood culture -D-dimer, BNP,Trop, LFT, CRP, LDH, Procalcitonin, Ferritin, fibinogen, TG, Hep B SAg, HIV ab -Daily CRP, Ferritin, D-dimer, -Will ask the patient to maintain an awake prone position for 16+ hours a day, if possible, with a minimum of 2-3 hours at a time -Will attempt to maintain euvolemia to a net negative fluid status -IF patient deteriorates, will consult PCCM and ID  Syncope and collapse and fall: No focal neurologic deficit on physical examination.  CT head negative for acute intracranial abnormalities.  Patient is alert, but answers questions slowly. -pt/ot -Continue wound check -Will get MRI of the brain -check orthostatic vital sign  Essential tremor -continue inderal          DVT ppx: SQ Lovenox Code Status: Full code Family Communication: not done, no family member is at bed side.  Patient states that he does not have any family.  He lives independently and does not have family here. Disposition Plan:  Anticipate discharge back to previous environment Consults called:  none Admission status: Med-surg bed as inpt        Status is: Inpatient  Remains inpatient appropriate because:Inpatient level of care appropriate due to severity of illness.  Patient presents with acute respiratory failure with hypoxia due to pneumonia secondary to COVID-19 infection.  Patient also has syncope.  His presentation is highly complicated.  He is at high risk of deterioration.  Patient will need to be treated in hospital for at least 2 days.   Dispo: The patient is from: Home              Anticipated d/c is to: Home              Anticipated d/c date is: 2 days              Patient currently is not medically stable to d/c.           Date of Service 05/05/2020    Ivor Costa [facility]   If 7PM-7AM, please contact night-coverage [email or web address] 05/05/2020, 5:01 PM

## 2020-05-06 ENCOUNTER — Inpatient Hospital Stay: Payer: Medicare HMO

## 2020-05-06 LAB — FERRITIN: Ferritin: 1469 ng/mL — ABNORMAL HIGH (ref 24–336)

## 2020-05-06 LAB — CBC WITH DIFFERENTIAL/PLATELET
Abs Immature Granulocytes: 0.04 10*3/uL (ref 0.00–0.07)
Basophils Absolute: 0 10*3/uL (ref 0.0–0.1)
Basophils Relative: 0 %
Eosinophils Absolute: 0 10*3/uL (ref 0.0–0.5)
Eosinophils Relative: 0 %
HCT: 44.3 % (ref 39.0–52.0)
Hemoglobin: 15.1 g/dL (ref 13.0–17.0)
Immature Granulocytes: 1 %
Lymphocytes Relative: 8 %
Lymphs Abs: 0.5 10*3/uL — ABNORMAL LOW (ref 0.7–4.0)
MCH: 31.1 pg (ref 26.0–34.0)
MCHC: 34.1 g/dL (ref 30.0–36.0)
MCV: 91.3 fL (ref 80.0–100.0)
Monocytes Absolute: 0.4 10*3/uL (ref 0.1–1.0)
Monocytes Relative: 6 %
Neutro Abs: 5 10*3/uL (ref 1.7–7.7)
Neutrophils Relative %: 85 %
Platelets: 211 10*3/uL (ref 150–400)
RBC: 4.85 MIL/uL (ref 4.22–5.81)
RDW: 12.4 % (ref 11.5–15.5)
WBC: 5.8 10*3/uL (ref 4.0–10.5)
nRBC: 0 % (ref 0.0–0.2)

## 2020-05-06 LAB — COMPREHENSIVE METABOLIC PANEL
ALT: 38 U/L (ref 0–44)
AST: 37 U/L (ref 15–41)
Albumin: 2.9 g/dL — ABNORMAL LOW (ref 3.5–5.0)
Alkaline Phosphatase: 47 U/L (ref 38–126)
Anion gap: 10 (ref 5–15)
BUN: 27 mg/dL — ABNORMAL HIGH (ref 8–23)
CO2: 27 mmol/L (ref 22–32)
Calcium: 8.9 mg/dL (ref 8.9–10.3)
Chloride: 101 mmol/L (ref 98–111)
Creatinine, Ser: 1.18 mg/dL (ref 0.61–1.24)
GFR calc non Af Amer: >60 mL/min (ref >60)
Glucose, Bld: 152 mg/dL — ABNORMAL HIGH (ref 70–99)
Potassium: 5 mmol/L (ref 3.5–5.1)
Sodium: 138 mmol/L (ref 135–145)
Total Bilirubin: 0.9 mg/dL (ref 0.3–1.2)
Total Protein: 6.8 g/dL (ref 6.5–8.1)

## 2020-05-06 LAB — FIBRIN DERIVATIVES D-DIMER (ARMC ONLY): Fibrin derivatives D-dimer (ARMC): 2513.56 ng/mL (FEU) — ABNORMAL HIGH (ref 0.00–499.00)

## 2020-05-06 LAB — MAGNESIUM: Magnesium: 2.7 mg/dL — ABNORMAL HIGH (ref 1.7–2.4)

## 2020-05-06 LAB — C-REACTIVE PROTEIN: CRP: 11.9 mg/dL — ABNORMAL HIGH (ref <1.0)

## 2020-05-06 MED ORDER — FUROSEMIDE 10 MG/ML IJ SOLN
40.0000 mg | Freq: Once | INTRAMUSCULAR | Status: AC
  Administered 2020-05-06: 40 mg via INTRAVENOUS
  Filled 2020-05-06: qty 4

## 2020-05-06 MED ORDER — ENSURE ENLIVE PO LIQD
237.0000 mL | Freq: Three times a day (TID) | ORAL | Status: DC
  Administered 2020-05-06 – 2020-05-11 (×10): 237 mL via ORAL

## 2020-05-06 MED ORDER — SODIUM CHLORIDE 0.9 % IV SOLN
INTRAVENOUS | Status: DC | PRN
  Administered 2020-05-06: 10:00:00 250 mL via INTRAVENOUS

## 2020-05-06 MED ORDER — ADULT MULTIVITAMIN W/MINERALS CH
1.0000 | ORAL_TABLET | Freq: Every day | ORAL | Status: DC
  Administered 2020-05-07 – 2020-05-11 (×5): 1 via ORAL
  Filled 2020-05-06 (×5): qty 1

## 2020-05-06 NOTE — Progress Notes (Signed)
Pt slept throughout the shift without any disturbances. Pt oxygen was increased from 4l via [place] to 6L via [place] to maintain oxygen saturation. Pt initial  CT scan results along with  bed side assessment warranted a stroke assessment as he was noted with expressive aphasia. NIH score is 1. Pt MRI is still pending at this time. Radiology dept called during the shift to inquire about his transfer and this nurse verified that it was ok to transfer pt for MRI. Follow up with radiology is needed to obtain the procedure. Pt is currently lying in bed at this time watching tv. No obvious distress is noted. Equal rise and fall of the chest is present. Vitals are within baseline limits.

## 2020-05-06 NOTE — Evaluation (Signed)
Occupational Therapy Evaluation Patient Details Name: Joseph Parsons MRN: [id number] DOB: [birth year] Today's Date: 05/06/2020    History of Present Illness 69yo male admitted with acute respiratory failure with hypoxia due to pneumonia due to COVID-19 virus. PMHx includes essential tremor.   Clinical Impression   Pt was seen for OT evaluation this date. Prior to hospital admission and recent PNA and Covid-19 dx, pt was independent and active, working full time in [employer] and working out at [employer] 4-5x/wk. Pt is also a retired Psychologist, forensic. Pt lives by himself and endorses having a coworker that may be able to assist intermittently if needed but no other family or friends who can help. Pt currently on 9L O2 at time of OT evaluation. Currently pt demonstrates impairments as described below (See OT problem list) which functionally limit his ability to perform ADL/self-care tasks. Pt currently requires supervision-CGA for ADL transfers and ADL in standing. Pt on 9L O2, 88-90% sats, HR in high 60's to low 70's, RR in low 20's - low 30's at rest; static standing EOB, 02 desats to mid 80's, HR high 70's and RR in mid to high 30's. Pt instructed in incentive spirometer, pursed lip breathing, activity pacing, and work simplification to maximize safety/indep while minimizing SOB/over exertion during ADL and IADL tasks. Pt verbalized understanding. 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 Encouragement provided to pt; pt very appreciative. Pt would benefit from skilled OT services to address noted impairments and functional limitations (see below for any additional details) in order to maximize safety and independence while minimizing falls risk and caregiver burden. Upon hospital discharge, recommend HHOT to maximize pt safety and return to functional independence during meaningful occupations of daily life, pending additional progress with therapy  and ability to decrease how much supplemental O2 required.     Follow Up Recommendations  Home health OT    Equipment Recommendations  None recommended by OT    Recommendations for Other Services       Precautions / Restrictions Precautions Precautions: Fall Precaution Comments: watch O2 sats Restrictions Weight Bearing Restrictions: No      Mobility Bed Mobility Overal bed mobility: Needs Assistance Bed Mobility: Supine to Sit;Sit to Supine     Supine to sit: Supervision Sit to supine: Supervision      Transfers Overall transfer level: Needs assistance Equipment used: None Transfers: Sit to/from Stand Sit to Stand: Min guard              Balance Overall balance assessment: Needs assistance Sitting-balance support: No upper extremity supported;Feet supported Sitting balance-Leahy Scale: Good     Standing balance support: Single extremity supported Standing balance-Leahy Scale: Fair Standing balance comment: initially able to stand without UE support, eventually requiring 1 UE support/handheld assist for improved stability                           ADL either performed or assessed with clinical judgement   ADL Overall ADL's : Needs assistance/impaired                                       General ADL Comments: Indep with self feeding and grooming at bed level, supervision and set up for seated EOB ADL tasks including LB dressing, CGA for ADL transfers, desats with exertion, requires rest breaks and  PLB to support breath recovery     Vision Baseline Vision/History: Wears glasses Wears Glasses: At all times;Reading only;Distance only Patient Visual Report: No change from baseline Vision Assessment?: No apparent visual deficits     Perception     Praxis      Pertinent Vitals/Pain Pain Assessment: No/denies pain     Hand Dominance Right   Extremity/Trunk Assessment Upper Extremity Assessment Upper Extremity  Assessment: Overall WFL for tasks assessed (mild FMC deficits 2/2 tremor)   Lower Extremity Assessment Lower Extremity Assessment: Overall WFL for tasks assessed   Cervical / Trunk Assessment Cervical / Trunk Assessment: Normal   Communication Communication Communication: No difficulties   Cognition Arousal/Alertness: Awake/alert Behavior During Therapy: WFL for tasks assessed/performed Overall Cognitive Status: Within Functional Limits for tasks assessed                                 General Comments: very slight processing time delay   General Comments  9L O2, 88-90% sats, HR in high 60's to low 70's, RR in low 20's - low 30's at rest; static standing EOB, 02 desats to mid 80's, PLB improves to high 80's/low 90's, tolerates standing for 2 min    Exercises Other Exercises Other Exercises: Pt instructed in incentive spirometer, pursed lip breathing, activity pacing, and work simplification to maximize safety/indep while minimizing SOB/over exertion during ADL and IADL tasks   Shoulder Instructions      Home Living Family/patient expects to be discharged to:: Private residence Living Arrangements: Alone Available Help at Discharge: Friend(s);Available PRN/intermittently (coworker) Type of Home: Apartment Home Access: Elevator     Home Layout: One level     Bathroom Shower/Tub: Occupational psychologist: Standard     Home Equipment: None          Prior Functioning/Environment Level of Independence: Independent        Comments: Active and Independent, 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, computer work), works out at [employer] 4-5x/wk, 2 falls in past week 2/2 passing out        OT Problem List: Cardiopulmonary status limiting activity;Decreased activity tolerance;Impaired balance (sitting and/or standing);Decreased knowledge of use of DME or AE      OT Treatment/Interventions: Self-care/ADL training;Therapeutic exercise;Therapeutic  activities;Energy conservation;DME and/or AE instruction;Patient/family education;Balance training    OT Goals(Current goals can be found in the care plan section) Acute Rehab OT Goals Patient Stated Goal: breathe better and go home OT Goal Formulation: With patient Time For Goal Achievement: 05/20/20 Potential to Achieve Goals: Good ADL Goals Pt Will Transfer to Toilet: with supervision;ambulating;bedside commode (LRAD for amb, O2 sats >90% on supplemental O2) Additional ADL Goal #1: Pt will tolerate >80min to perform seated morning ADL routine using learned ECS to support breath recovery with O2 sats >90% on supplemental O2 as needed. Additional ADL Goal #2: Pt will verbalize plan to implement at least 3 learned ECS to support ADL and IADL participation.  OT Frequency: Min 2X/week   Barriers to D/C:            Co-evaluation              AM-PAC OT "6 Clicks" Daily Activity     Outcome Measure Help from another person eating meals?: None Help from another person taking care of personal grooming?: None Help from another person toileting, which includes using toliet, bedpan, or urinal?: A Little Help from another  person bathing (including washing, rinsing, drying)?: A Little Help from another person to put on and taking off regular upper body clothing?: None Help from another person to put on and taking off regular lower body clothing?: A Little 6 Click Score: 21   End of Session    Activity Tolerance: Patient tolerated treatment well Patient left: in bed;with call bell/phone within reach;with bed alarm set  OT Visit Diagnosis: Other abnormalities of gait and mobility (R26.89);History of falling (Z91.81)                Time: 4580-9983 OT Time Calculation (min): 52 min Charges:  OT General Charges $OT Visit: 1 Visit OT Evaluation $OT Eval Moderate Complexity: 1 Mod OT Treatments $Self Care/Home Management : 23-37 mins $Therapeutic Activity: 8-22 mins  Jeni Salles,  MPH, MS, OTR/L ascom [phone] 05/06/20, 11:15 AM

## 2020-05-06 NOTE — Evaluation (Signed)
Physical Therapy Evaluation Patient Details Name: Joseph Parsons MRN: [id number] DOB: [birth year] Today's Date: 05/06/2020   History of Present Illness  69yo male admitted with acute respiratory failure with hypoxia due to pneumonia due to COVID-19 virus. PMHx includes essential tremor.     Clinical Impression  Pt received in supine position upon arrival and agreeable to therapy.  Pt was able to perform bed-level exercises without much difficulty.  Pt then proceeded to perform bed mobility and transfer to seated EOB.  Pt requires handrail for assistance.  Pt able to push through BUEs to come upright in standing position.  Pt had quick descent back to bed and noted the socks on his feet must have slipped, however socks were donned correctly.  Pt was assessed for dizziness and reported minimal amount of dizziness.  Pt able to attempt standing transition again with good confidence.  Pt the proceeded to march in place before attempting transfer over to recliner.  Pt able to perform transfer smoothly with CGA apple for safety and management of lines.  Pt then requested to get back into bed and was able to perform transfer without difficulty.  Pt left with call bell within reach and all needs met.  Pt will benefit from PT services in a HHPT setting upon discharge to safely address deficits listed in patient problem list for decreased caregiver assistance and eventual return to PLOF.     Follow Up Recommendations Home health PT    Equipment Recommendations  Rolling walker with 5" wheels;3in1 (PT)    Recommendations for Other Services       Precautions / Restrictions Precautions Precautions: Fall Precaution Comments: watch O2 sats Restrictions Weight Bearing Restrictions: No      Mobility  Bed Mobility Overal bed mobility: Needs Assistance Bed Mobility: Supine to Sit;Sit to Supine     Supine to sit: Supervision Sit to supine: Supervision      Transfers Overall transfer level: Needs  assistance Equipment used: None Transfers: Sit to/from Stand Sit to Stand: Min guard         General transfer comment: Upon standing for the first time with therapist, pt did have LOB posteriorly that he reported was due to socks.  Pt ended in seated position on bed and was assessed for dizziness.  Ambulation/Gait                Stairs            Wheelchair Mobility    Modified Rankin (Stroke Patients Only)       Balance Overall balance assessment: Needs assistance Sitting-balance support: No upper extremity supported;Feet supported Sitting balance-Leahy Scale: Good     Standing balance support: Single extremity supported Standing balance-Leahy Scale: Fair Standing balance comment: pt able to stand without UE support, however unable to tolerate challenge without posterior lean/descent.                             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, 2 falls  in past week 2/2 passing out     Hand Dominance   Dominant Hand: Right    Extremity/Trunk Assessment   Upper Extremity Assessment Upper Extremity Assessment: Overall WFL for tasks assessed    Lower Extremity Assessment Lower Extremity Assessment: Overall WFL for tasks assessed    Cervical / Trunk Assessment Cervical / Trunk Assessment: Normal  Communication   Communication: No difficulties  Cognition Arousal/Alertness: Awake/alert Behavior During Therapy: WFL for tasks assessed/performed Overall  Cognitive Status: Within Functional Limits for tasks assessed                                 General Comments: very slight processing time delay      General Comments General comments (skin integrity, edema, etc.): 12L O2, 92-94% sats, HR in high 60's to low 70's, RR in low 20's - low 30's at rest; static standing EOB, 02 stays 90%, pt able to stand for ~4 minutes and with marching after second attempt to stand.    Exercises Total Joint Exercises Ankle Circles/Pumps: AROM;Strengthening;Both;10 reps;Supine Quad Sets: AROM;Strengthening;Both;10 reps;Supine Gluteal Sets: AROM;Strengthening;Both;10 reps;Supine Heel Slides: AROM;Strengthening;Both;10 reps;Supine Hip ABduction/ADduction: AROM;Strengthening;Both;10 reps;Supine Straight Leg Raises: AROM;Strengthening;Both;10 reps;Supine Long Arc Quad: AROM;Strengthening;Both;10 reps;Seated Marching in Standing: AROM;Strengthening;Both;10 reps;Standing Other Exercises Other Exercises: Pt instructed in incentive spirometer, pursed lip breathing, activity pacing, and work simplification to maximize safety/indep while minimizing SOB/over exertion during ADL and IADL tasks Other Exercises: Pt educateed on role of PT and services provided during stay in hospital. Other Exercises: PT educated on importance of utilizing pursed lip breathing technique for adequate O2 saturation within the body during exercises/transfers/ambulation.   Assessment/Plan    PT Assessment Patient needs continued PT services  PT Problem List Decreased activity tolerance;Decreased balance;Decreased mobility;Decreased safety awareness       PT Treatment Interventions DME instruction;Gait training;Therapeutic activities;Therapeutic exercise    PT Goals (Current goals can be found in the Care Plan section)  Acute Rehab PT Goals Patient Stated Goal: breathe better and go home PT Goal Formulation: With patient Time For Goal Achievement: 05/20/20 Potential to  Achieve Goals: Good    Frequency Min 2X/week   Barriers to discharge        Co-evaluation               AM-PAC PT "6 Clicks" Mobility  Outcome Measure Help needed turning from your back to your side while in a flat bed without using bedrails?: A Little Help needed moving from lying on your back to sitting on the side of a flat bed without using bedrails?: A Little Help needed moving to and from a bed to a chair (including a wheelchair)?: A Little Help needed standing up from a chair using your arms (e.g., wheelchair or bedside chair)?: A Little Help needed to walk in hospital room?: A Lot Help needed climbing 3-5 steps with a railing? : A Lot 6 Click Score: 16    End of Session Equipment Utilized During Treatment: Oxygen Activity Tolerance: Patient tolerated treatment well Patient left: in bed Nurse Communication: Mobility status PT Visit Diagnosis: Unsteadiness on feet (R26.81);Other abnormalities of gait and mobility (R26.89);Difficulty in walking, not elsewhere classified (R26.2)    Time: 6468-0321 PT Time Calculation (min) (ACUTE ONLY): 30 min   Charges:   PT Evaluation $PT Eval Low Complexity: 1 Low PT Treatments $Therapeutic Exercise: 23-37 mins        Gwenlyn Saran, PT, DPT 05/06/20, 2:45 PM

## 2020-05-06 NOTE — Progress Notes (Signed)
Initial Nutrition Assessment  DOCUMENTATION CODES:   Not applicable  INTERVENTION:   Ensure Enlive po TID, each supplement provides 350 kcal and 20 grams of protein  Magic cup TID with meals, each supplement provides 290 kcal and 9 grams of protein  MVI daily   Liberalize diet   Pt at high refeed risk; recommend monitor potassium, magnesium and phosphorus labs daily as oral intake improves  NUTRITION DIAGNOSIS:   Increased nutrient needs related to catabolic illness (COVID 19) as evidenced by increased estimated needs.  GOAL:   Patient will meet greater than or equal to 90% of their needs  MONITOR:   PO intake, Supplement acceptance, Labs, Weight trends, I & O's, Skin  REASON FOR ASSESSMENT:   Malnutrition Screening Tool    ASSESSMENT:   69 y.o. male with medical history significant of essential tremor who is admitted with COVID 19 PNA.  RD working remotely.  Unable to reach pt by phone. Per RN note from this morning, pt having expressive aphasia overnight and MRI is pending. Per CT scan, no acute intracranial abnormality identified. Per chart review, pt with poor appetite and oral intake pta; RD suspects pt with poor oral intake for 2-3 weeks r/t COVID 19. Per chart, pt has lost 13lbs(6%) over the past 3 weeks; this is significant. RD will add supplements and MVI to help pt meet his estimated needs. RD will also liberalize pt's diet as a heart healthy diet is restrictive of protein. Pt is at high refeed risk. RD will attempt to obtain nutrition related history at follow-up.   Medications reviewed and include: vitamin C, lovenox, solu-medrol, MVI, zinc   Labs reviewed: K 5.0 wnl, BUN 27(H), Mg 2.7(H)  NUTRITION - FOCUSED PHYSICAL EXAM: Unable to perform at this time   Diet Order:   Diet Order            Diet regular Room service appropriate? Yes; Fluid consistency: Thin  Diet effective now                EDUCATION NEEDS:   Not appropriate for education at  this time  Skin:  Skin Assessment: Reviewed RN Assessment (laceration arm s/p fall)  Last BM:  10/7  Height:   Ht Readings from Last 1 Encounters:  05/05/20 5\' 10"  (1.778 m)    Weight:   Wt Readings from Last 1 Encounters:  05/05/20 86.2 kg    Ideal Body Weight:  75.45 kg  BMI:  Body mass index is 27.26 kg/m.  Estimated Nutritional Needs:   Kcal:  2200-2500kcal/day  Protein:  110-125g/day  Fluid:  2-2.3L/day  Koleen Distance MS, RD, LDN Please refer to [facility] for RD and/or RD on-call/weekend/after hours pager

## 2020-05-06 NOTE — Progress Notes (Signed)
PROGRESS NOTE    Joseph Parsons  TKW:[id number] DOB: 08-Oct-1950 DOA: 05/05/2020 PCP: [facility], [facility]   Brief Narrative: Joseph Parsons is a 69 y.o. male with medical history significant of essential tremor, who presents with cough, shortness breath, syncope.  Pt states that he has been having cough and shortness breath for several weeks.  He was recently diagnosed with pneumonia 3 weeks ago and finished a course of antibiotics.  He states that he continues to have cough and shortness of breath.  He was tested positive for Covid19 one week ago.  Patient denies fever or chills.  No chest pain.  Patient has intermittent diarrhea, denies nausea, vomiting, abdominal pain.  No symptoms of UTI.  Patient states that he was fully vaccinated against COVID-19. Patient states that he has been feeling weak. He has lack of energy and poor appetite. Patient states that he passed out yesterday  Oxygen requirement increased since admission.  Now on 8 to 9 L nasal cannula.  Mentating clearly.  Normal work of breathing.   Assessment & Plan:   Principal Problem:   Pneumonia due to COVID-19 virus Active Problems:   Syncope and collapse   Fall   Acute respiratory failure with hypoxia (HCC)   Essential tremor  Acute respiratory failure with hypoxia due to pneumonia due to COVID-19 virus: Oxygen desaturation to 85% on room air, improved to 93 on 3 L nasal cannula oxygen.  Chest x-ray is positive for bilateral patchy infiltration.  CT angiogram negative for PE.  Lower extremity Doppler negative for DVT. -Oxygen saturation worsened since admission.  Now on 9 L Plan: -Remdesivir per pharm, dose 2/5 -Solumedrol 40 mg bid, dose 2/10 -vitamin C, zinc.  -Bronchodilators -PRN Mucinex for cough -f/u Blood culture, no growth to date -Lasix 40 mg IV x1.  Reassess daily for diuretic need and tolerance -Prone as tolerated.  Stress I-S use  Syncope and collapse and fall No focal neurologic  deficit on physical examination.   CT head negative for acute intracranial abnormalities.   Patient is alert, but answers questions slowly. No focal deficits to indicate possible CVA Not orthostatic Plan: Therapy evaluations Hold MRI brain at this time Treat Covid as above  Essential tremor -continue inderal   DVT prophylaxis: Lovenox Code Status: Full Family Communication: None today.  Offered to call family but patient declined Disposition Plan: Status is: Inpatient  Remains inpatient appropriate because:Inpatient level of care appropriate due to severity of illness   Dispo: The patient is from: Home              Anticipated d/c is to: Home              Anticipated d/c date is: 3 days              Patient currently is not medically stable to d/c.  Hypoxic respiratory failure in the setting of multifocal pneumonia due to COVID-19.  On 9 L.  Anticipate 3 additional days of inpatient treatment and monitoring prior to disposition planning.       Consultants:   None  Procedures:   None  Antimicrobials:   Remdesivir   Subjective: Seen and examined.  Endorses some cough and shortness of breath  Objective: Vitals:   05/06/20 0548 05/06/20 0555 05/06/20 0814 05/06/20 1001  BP: 120/77  126/75 (!) 147/82  Pulse: 61  63 65  Resp: 20  (!) 22   Temp: 97.7 F (36.5 C)  97.6 F (36.4  C)   TempSrc: Oral  Oral   SpO2: (!) 89% 91% 92%   Weight:      Height:       No intake or output data in the 24 hours ending 05/06/20 1034 Filed Weights   05/05/20 1010  Weight: 86.2 kg    Examination:  General exam: Appears calm and comfortable  Respiratory system: Coarse crackles bilaterally.  Normal work of breathing.  9 L Cardiovascular system: S1 & S2 heard, RRR. No JVD, murmurs, rubs, gallops or clicks. No pedal edema. Gastrointestinal system: Abdomen is nondistended, soft and nontender. No organomegaly or masses felt. Normal bowel sounds heard. Central nervous system:  Alert and oriented.  No focal deficits.  Extremities: Symmetric 5 x 5 power. Skin: No rashes, lesions or ulcers Psychiatry: Judgement and insight appear normal. Mood & affect flattened.     Data Reviewed: I have personally reviewed following labs and imaging studies  CBC: Recent Labs  Lab 05/05/20 1013 05/06/20 0445  WBC 9.7 5.8  NEUTROABS  --  5.0  HGB 15.9 15.1  HCT 47.7 44.3  MCV 91.2 91.3  PLT 211 675   Basic Metabolic Panel: Recent Labs  Lab 05/05/20 1013 05/05/20 1021 05/06/20 0445  NA 135  --  138  K 3.9  --  5.0  CL 98  --  101  CO2 24  --  27  GLUCOSE 114*  --  152*  BUN 28*  --  27*  CREATININE 1.07  --  1.18  CALCIUM 8.5*  --  8.9  MG  --  2.7* 2.7*   GFR: Estimated Creatinine Clearance: 61.9 mL/min (by C-G formula based on SCr of 1.18 mg/dL). Liver Function Tests: Recent Labs  Lab 05/05/20 1013 05/06/20 0445  AST 46* 37  ALT 40 38  ALKPHOS 49 47  BILITOT 1.4* 0.9  PROT 7.1 6.8  ALBUMIN 3.2* 2.9*   No results for input(s): LIPASE, AMYLASE in the last 168 hours. No results for input(s): AMMONIA in the last 168 hours. Coagulation Profile: No results for input(s): INR, PROTIME in the last 168 hours. Cardiac Enzymes: No results for input(s): CKTOTAL, CKMB, CKMBINDEX, TROPONINI in the last 168 hours. BNP (last 3 results) No results for input(s): PROBNP in the last 8760 hours. HbA1C: No results for input(s): HGBA1C in the last 72 hours. CBG: No results for input(s): GLUCAP in the last 168 hours. Lipid Profile: Recent Labs    05/05/20 1423  TRIG 95   Thyroid Function Tests: No results for input(s): TSH, T4TOTAL, FREET4, T3FREE, THYROIDAB in the last 72 hours. Anemia Panel: Recent Labs    05/05/20 1423 05/06/20 0445  FERRITIN 2,086* 1,469*   Sepsis Labs: Recent Labs  Lab 05/05/20 1028  PROCALCITON 0.17    Recent Results (from the past 240 hour(s))  Respiratory Panel by RT PCR (Flu A&B, Covid) - Nasopharyngeal Swab     Status:  Abnormal   Collection Time: 05/05/20 11:09 AM   Specimen: Nasopharyngeal Swab  Result Value Ref Range Status   SARS Coronavirus 2 by RT PCR POSITIVE (A) NEGATIVE Final    Comment: RESULT CALLED TO, READ BACK BY AND VERIFIED WITH: KENDALL Viets 05/05/20 1208 KLW (NOTE) SARS-CoV-2 target nucleic acids are DETECTED.  SARS-CoV-2 RNA is generally detectable in upper respiratory specimens  during the acute phase of infection. Positive results are indicative of the presence of the identified virus, but do not rule out bacterial infection or co-infection with other pathogens not detected by the test.  Clinical correlation with patient history and other diagnostic information is necessary to determine patient infection status. The expected result is Negative.  Fact Sheet for Patients:  PinkCheek.be  Fact Sheet for Healthcare Providers: GravelBags.it  This test is not yet approved or cleared by the [place] FDA and  has been authorized for detection and/or diagnosis of SARS-CoV-2 by FDA under an Emergency Use Authorization (EUA).  This EUA will remain in effect (meaning this test can be u sed) for the duration of  the COVID-19 declaration under Section 564(b)(1) of the Act, 21 U.S.C. section 360bbb-3(b)(1), unless the authorization is terminated or revoked sooner.      Influenza A by PCR NEGATIVE NEGATIVE Final   Influenza B by PCR NEGATIVE NEGATIVE Final    Comment: (NOTE) The Xpert Xpress SARS-CoV-2/FLU/RSV assay is intended as an aid in  the diagnosis of influenza from Nasopharyngeal swab specimens and  should not be used as a sole basis for treatment. Nasal washings and  aspirates are unacceptable for Xpert Xpress SARS-CoV-2/FLU/RSV  testing.  Fact Sheet for Patients: PinkCheek.be  Fact Sheet for Healthcare Providers: GravelBags.it  This test is not yet  approved or cleared by the [place] FDA and  has been authorized for detection and/or diagnosis of SARS-CoV-2 by  FDA under an Emergency Use Authorization (EUA). This EUA will remain  in effect (meaning this test can be used) for the duration of the  Covid-19 declaration under Section 564(b)(1) of the Act, 21  U.S.C. section 360bbb-3(b)(1), unless the authorization is  terminated or revoked. Performed at [facility], [address] [place], [place] [address]   Culture, blood (Routine X 2) w Reflex to ID Panel     Status: None (Preliminary result)   Collection Time: 05/05/20  2:23 PM   Specimen: BLOOD  Result Value Ref Range Status   Specimen Description BLOOD RIGHT ASSIST CONTROL  Final   Special Requests   Final    BOTTLES DRAWN AEROBIC AND ANAEROBIC Blood Culture adequate volume   Culture   Final    NO GROWTH < 24 HOURS Performed at [facility], [address] [place], [place] [address]    Report Status PENDING  Incomplete  Culture, blood (Routine X 2) w Reflex to ID Panel     Status: None (Preliminary result)   Collection Time: 05/05/20  2:30 PM   Specimen: BLOOD  Result Value Ref Range Status   Specimen Description BLOOD LEFT ASSIST CONTROL  Final   Special Requests   Final    BOTTLES DRAWN AEROBIC AND ANAEROBIC Blood Culture adequate volume   Culture   Final    NO GROWTH < 24 HOURS Performed at [facility], [address], [place], [place] [address]    Report Status PENDING  Incomplete         Radiology Studies: DG Chest 2 View  Result Date: 05/05/2020 CLINICAL DATA:  Productive cough. EXAM: CHEST - 2 VIEW COMPARISON:  [facility]. FINDINGS: Interval development of patchy basilar predominant airspace disease, left greater than right. No pleural effusion. Cardiopericardial silhouette is at upper limits of normal for size. The visualized bony structures of the thorax show no acute abnormality.  IMPRESSION: Patchy bilateral basilar predominant airspace disease compatible with multifocal pneumonia. Electronically Signed   By: Misty Stanley M.D.   On: 05/05/2020 10:46   CT HEAD WO CONTRAST  Result Date: 05/05/2020 CLINICAL DATA:  69 year old male with weakness, fatigue, productive cough, COVID-19.  EXAM: CT HEAD WITHOUT CONTRAST TECHNIQUE: Contiguous axial images were obtained from the base of the skull through the vertex without intravenous contrast. COMPARISON:  None. FINDINGS: Brain: Cerebral volume is within normal limits for age. No midline shift, mass effect, or evidence of intracranial mass lesion. No ventriculomegaly. No acute intracranial hemorrhage identified. 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 Perivascular spaces less likely. Largely normal for age gray-white matter differentiation elsewhere. No acute cortical infarct or chronic cortical encephalomalacia identified. Vascular: Mild Calcified atherosclerosis at the skull base. No suspicious intracranial vascular hyperdensity. Skull: No acute osseous abnormality identified. Sinuses/Orbits: Small fluid level with bubbly opacity in the right maxillary sinus. Minor ethmoid mucosal thickening. Other sinuses, the tympanic cavities and mastoids are well pneumatized. Other: No acute orbit or scalp soft tissue findings. Postoperative changes to the left globe. IMPRESSION: 1. No acute intracranial abnormality. 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 2. Acute right maxillary sinus inflammation. Electronically Signed   By: Genevie Ann M.D.   On: 05/05/2020 13:28   CT Angio Chest PE W and/or Wo Contrast  Result Date: 05/05/2020 CLINICAL DATA:  69 year old male with weakness, fatigue, productive cough. Treated for pneumonia in September with antibiotics. Subsequently diagnosed with COVID-19. EXAM: CT ANGIOGRAPHY CHEST WITH CONTRAST TECHNIQUE: Multidetector CT imaging of the chest was performed  using the standard protocol during bolus administration of intravenous contrast. Multiplanar CT image reconstructions and MIPs were obtained to evaluate the vascular anatomy. CONTRAST:  75mL OMNIPAQUE IOHEXOL 350 MG/ML SOLN COMPARISON:  Chest radiographs earlier today. FINDINGS: Cardiovascular: Good contrast bolus timing in the pulmonary arterial tree. No focal filling defect identified in the pulmonary arteries to suggest acute pulmonary embolism. Cardiac size at the upper limits of normal.  Negative visible aorta. Mediastinum/Nodes: Negative; upper limits of normal right carina and subcarinal lymph nodes. Lungs/Pleura: Bilateral lower lobe consolidation involving the posterior basal segments. Additional streaky opacity in the other lower lobe segments. Peripheral and peribronchial mostly ground-glass opacity in both upper lobes. Major airways remain patent. No pleural effusion. Upper Abdomen: Negative visible liver, gallbladder, spleen, pancreas, adrenal glands, kidneys, and bowel in the upper abdomen. Musculoskeletal: Degenerative vertebral endplate and costovertebral spurring at multiple levels. No acute or suspicious osseous lesion. Review of the MIP images confirms the above findings. IMPRESSION: 1. Negative for acute pulmonary embolus. 2. Widespread bilateral pneumonia typical in appearance for COVID-19. Some consolidation in both lower lobes. No pleural effusion. Electronically Signed   By: Genevie Ann M.D.   On: 05/05/2020 13:25   US Venous Img Lower Bilateral (DVT)  Result Date: 05/05/2020 CLINICAL DATA:  Positive D-dimer study.  Recent fall EXAM: BILATERAL LOWER EXTREMITY VENOUS DUPLEX ULTRASOUND TECHNIQUE: Gray-scale sonography with graded compression, as well as color Doppler and duplex ultrasound were performed to evaluate the lower extremity deep venous systems from the level of the common femoral vein and including the common femoral, femoral, profunda femoral, popliteal and calf veins including the  posterior tibial, peroneal and gastrocnemius veins when visible. The superficial great saphenous vein was also interrogated. Spectral Doppler was utilized to evaluate flow at rest and with distal augmentation maneuvers in the common femoral, femoral and popliteal veins. COMPARISON:  None. FINDINGS: RIGHT LOWER EXTREMITY Common Femoral Vein: No evidence of thrombus. Normal compressibility, respiratory phasicity and response to augmentation. Saphenofemoral Junction: No evidence of thrombus. Normal compressibility and flow on color Doppler imaging. Profunda Femoral Vein: No evidence of thrombus. Normal compressibility and flow on color Doppler imaging. Femoral  Vein: No evidence of thrombus. Normal compressibility, respiratory phasicity and response to augmentation. Popliteal Vein: No evidence of thrombus. Normal compressibility, respiratory phasicity and response to augmentation. Calf Veins: No evidence of thrombus. Normal compressibility and flow on color Doppler imaging. Superficial Great Saphenous Vein: No evidence of thrombus. Normal compressibility. Venous Reflux:  None. Other Findings:  Blood flow has a rouleaux type pattern. LEFT LOWER EXTREMITY Common Femoral Vein: No evidence of thrombus. Normal compressibility, respiratory phasicity and response to augmentation. Saphenofemoral Junction: No evidence of thrombus. Normal compressibility and flow on color Doppler imaging. Profunda Femoral Vein: No evidence of thrombus. Normal compressibility and flow on color Doppler imaging. Femoral Vein: No evidence of thrombus. Normal compressibility, respiratory phasicity and response to augmentation. Popliteal Vein: No evidence of thrombus. Normal compressibility, respiratory phasicity and response to augmentation. Calf Veins: No evidence of thrombus. Normal compressibility and flow on color Doppler imaging. Superficial Great Saphenous Vein: No evidence of thrombus. Normal compressibility. Venous Reflux:  None. Other  Findings:  Blood flow as a rouleaux type pattern. IMPRESSION: No evidence of deep venous thrombosis in either lower extremity. Blood flow has a rouleaux type pattern bilaterally suggesting slow flow. Clinical significance of this finding uncertain. Electronically Signed   By: Lowella Grip III M.D.   On: 05/05/2020 14:44        Scheduled Meds: . vitamin C  500 mg Oral Daily  . enoxaparin (LOVENOX) injection  40 mg Subcutaneous Q24H  . feeding supplement (ENSURE ENLIVE)  237 mL Oral TID BM  . ipratropium  2 puff Inhalation Q4H  . methylPREDNISolone (SOLU-MEDROL) injection  40 mg Intravenous Q12H  . [START ON 05/07/2020] multivitamin with minerals  1 tablet Oral Daily  . propranolol ER  60 mg Oral q morning - 10a  . zinc sulfate  220 mg Oral Daily   Continuous Infusions: . sodium chloride 250 mL (05/06/20 0953)  . remdesivir 100 mg in NS 100 mL 100 mg (05/06/20 0955)     LOS: 1 day    Time spent: 25 minutes    Sidney Ace, MD [facility] Pager [phone]  If 7PM-7AM, please contact night-coverage 05/06/2020, 10:34 AM

## 2020-05-07 LAB — CBC WITH DIFFERENTIAL/PLATELET
Abs Immature Granulocytes: 0.07 10*3/uL (ref 0.00–0.07)
Basophils Absolute: 0 10*3/uL (ref 0.0–0.1)
Basophils Relative: 0 %
Eosinophils Absolute: 0 10*3/uL (ref 0.0–0.5)
Eosinophils Relative: 0 %
HCT: 43.1 % (ref 39.0–52.0)
Hemoglobin: 14.6 g/dL (ref 13.0–17.0)
Immature Granulocytes: 1 %
Lymphocytes Relative: 5 %
Lymphs Abs: 0.7 10*3/uL (ref 0.7–4.0)
MCH: 30.7 pg (ref 26.0–34.0)
MCHC: 33.9 g/dL (ref 30.0–36.0)
MCV: 90.7 fL (ref 80.0–100.0)
Monocytes Absolute: 0.5 10*3/uL (ref 0.1–1.0)
Monocytes Relative: 4 %
Neutro Abs: 11.1 10*3/uL — ABNORMAL HIGH (ref 1.7–7.7)
Neutrophils Relative %: 90 %
Platelets: 256 10*3/uL (ref 150–400)
RBC: 4.75 MIL/uL (ref 4.22–5.81)
RDW: 12.2 % (ref 11.5–15.5)
WBC: 12.4 10*3/uL — ABNORMAL HIGH (ref 4.0–10.5)
nRBC: 0 % (ref 0.0–0.2)

## 2020-05-07 LAB — COMPREHENSIVE METABOLIC PANEL
ALT: 45 U/L — ABNORMAL HIGH (ref 0–44)
AST: 36 U/L (ref 15–41)
Albumin: 2.8 g/dL — ABNORMAL LOW (ref 3.5–5.0)
Alkaline Phosphatase: 49 U/L (ref 38–126)
Anion gap: 7 (ref 5–15)
BUN: 35 mg/dL — ABNORMAL HIGH (ref 8–23)
CO2: 28 mmol/L (ref 22–32)
Calcium: 8.5 mg/dL — ABNORMAL LOW (ref 8.9–10.3)
Chloride: 100 mmol/L (ref 98–111)
Creatinine, Ser: 1.05 mg/dL (ref 0.61–1.24)
GFR, Estimated: >60 mL/min (ref >60)
Glucose, Bld: 171 mg/dL — ABNORMAL HIGH (ref 70–99)
Potassium: 4.5 mmol/L (ref 3.5–5.1)
Sodium: 135 mmol/L (ref 135–145)
Total Bilirubin: 0.9 mg/dL (ref 0.3–1.2)
Total Protein: 6.6 g/dL (ref 6.5–8.1)

## 2020-05-07 LAB — C-REACTIVE PROTEIN: CRP: 5.2 mg/dL — ABNORMAL HIGH (ref <1.0)

## 2020-05-07 LAB — MAGNESIUM: Magnesium: 2.6 mg/dL — ABNORMAL HIGH (ref 1.7–2.4)

## 2020-05-07 LAB — FERRITIN: Ferritin: 1059 ng/mL — ABNORMAL HIGH (ref 24–336)

## 2020-05-07 LAB — FIBRIN DERIVATIVES D-DIMER (ARMC ONLY): Fibrin derivatives D-dimer (ARMC): 1780.8 ng/mL (FEU) — ABNORMAL HIGH (ref 0.00–499.00)

## 2020-05-07 MED ORDER — FUROSEMIDE 10 MG/ML IJ SOLN
40.0000 mg | Freq: Once | INTRAMUSCULAR | Status: AC
  Administered 2020-05-07: 11:00:00 40 mg via INTRAVENOUS
  Filled 2020-05-07: qty 4

## 2020-05-07 NOTE — Progress Notes (Signed)
PROGRESS NOTE    Joseph Parsons  GYI:[id number] DOB: 1951/05/01 DOA: 05/05/2020 PCP: [facility], [facility]   Brief Narrative: Joseph Parsons is a 69 y.o. male with medical history significant of essential tremor, who presents with cough, shortness breath, syncope.  Pt states that he has been having cough and shortness breath for several weeks.  He was recently diagnosed with pneumonia 3 weeks ago and finished a course of antibiotics.  He states that he continues to have cough and shortness of breath.  He was tested positive for Covid19 one week ago.  Patient denies fever or chills.  No chest pain.  Patient has intermittent diarrhea, denies nausea, vomiting, abdominal pain.  No symptoms of UTI.  Patient states that he was fully vaccinated against COVID-19. Patient states that he has been feeling weak. He has lack of energy and poor appetite. Patient states that he passed out yesterday  Oxygen requirement increased to 12 L, now down to 10 high flow nasal cannula.  Patient clinically appears well.   Assessment & Plan:   Principal Problem:   Pneumonia due to COVID-19 virus Active Problems:   Syncope and collapse   Fall   Acute respiratory failure with hypoxia (HCC)   Essential tremor  Acute respiratory failure with hypoxia due to pneumonia due to COVID-19 virus: Oxygen desaturation to 85% on room air, improved to 93 on 3 L nasal cannula oxygen.  Chest x-ray is positive for bilateral patchy infiltration.  CT angiogram negative for PE.  Lower extremity Doppler negative for DVT. -Currently on 10 L high flow nasal cannula Plan: -Remdesivir per pharm, dose 3/5 -Solumedrol 40 mg bid, dose 3/10 -vitamin C, zinc.  -Bronchodilators -PRN Mucinex for cough -f/u Blood culture, no growth to date -Lasix 40 mg IV x1.  Reassess daily for diuretic need and tolerance -Prone as tolerated.  Stress I-S use  Syncope and collapse and fall No focal neurologic deficit on physical examination.    CT head negative for acute intracranial abnormalities.   Patient is alert, but answers questions slowly. No focal deficits to indicate possible CVA Not orthostatic Plan: Therapy evaluations Hold MRI brain at this time Treat Covid as above  Essential tremor -continue inderal   DVT prophylaxis: Lovenox Code Status: Full Family Communication: None today.  Offered to call family but patient declined Disposition Plan: Status is: Inpatient  Remains inpatient appropriate because:Inpatient level of care appropriate due to severity of illness   Dispo: The patient is from: Home              Anticipated d/c is to: Home              Anticipated d/c date is: 3 days              Patient currently is not medically stable to d/c.  Hypoxic respiratory failure.  Currently on 10 L high flow nasal cannula.  Anticipate 3 additional days of inpatient treatment and monitoring prior to disposition planning.  Consultants:   None  Procedures:   None  Antimicrobials:   Remdesivir   Subjective: Seen and examined.  Improving shortness of breath.  Still with some cough.  Objective: Vitals:   05/07/20 0020 05/07/20 0550 05/07/20 0825 05/07/20 1100  BP: 108/65 107/72 127/80   Pulse: (!) 56 (!) 53 (!) 49   Resp: 16 18 18    Temp: 97.9 F (36.6 C) 97.6 F (36.4 C) 97.8 F (36.6 C)   TempSrc: Oral Oral Oral  SpO2: 96% 95% 96% 94%  Weight:      Height:        Intake/Output Summary (Last 24 hours) at 05/07/2020 1146 Last data filed at 05/07/2020 1136 Gross per 24 hour  Intake 109.47 ml  Output 750 ml  Net -640.53 ml   Filed Weights   05/05/20 1010  Weight: 86.2 kg    Examination:  General exam: Appears calm and comfortable  Respiratory system: Coarse crackles bilaterally.  Normal work of breathing.  10 L Cardiovascular system: S1 & S2 heard, RRR. No JVD, murmurs, rubs, gallops or clicks. No pedal edema. Gastrointestinal system: Abdomen is nondistended, soft and nontender. No  organomegaly or masses felt. Normal bowel sounds heard. Central nervous system: Alert and oriented.  No focal deficits.  Extremities: Symmetric 5 x 5 power. Skin: No rashes, lesions or ulcers Psychiatry: Judgement and insight appear normal. Mood & affect flattened.     Data Reviewed: I have personally reviewed following labs and imaging studies  CBC: Recent Labs  Lab 05/05/20 1013 05/06/20 0445 05/07/20 0518  WBC 9.7 5.8 12.4*  NEUTROABS  --  5.0 11.1*  HGB 15.9 15.1 14.6  HCT 47.7 44.3 43.1  MCV 91.2 91.3 90.7  PLT 211 211 030   Basic Metabolic Panel: Recent Labs  Lab 05/05/20 1013 05/05/20 1021 05/06/20 0445 05/07/20 0518  NA 135  --  138 135  K 3.9  --  5.0 4.5  CL 98  --  101 100  CO2 24  --  27 28  GLUCOSE 114*  --  152* 171*  BUN 28*  --  27* 35*  CREATININE 1.07  --  1.18 1.05  CALCIUM 8.5*  --  8.9 8.5*  MG  --  2.7* 2.7* 2.6*   GFR: Estimated Creatinine Clearance: 69.5 mL/min (by C-G formula based on SCr of 1.05 mg/dL). Liver Function Tests: Recent Labs  Lab 05/05/20 1013 05/06/20 0445 05/07/20 0518  AST 46* 37 36  ALT 40 38 45*  ALKPHOS 49 47 49  BILITOT 1.4* 0.9 0.9  PROT 7.1 6.8 6.6  ALBUMIN 3.2* 2.9* 2.8*   No results for input(s): LIPASE, AMYLASE in the last 168 hours. No results for input(s): AMMONIA in the last 168 hours. Coagulation Profile: No results for input(s): INR, PROTIME in the last 168 hours. Cardiac Enzymes: No results for input(s): CKTOTAL, CKMB, CKMBINDEX, TROPONINI in the last 168 hours. BNP (last 3 results) No results for input(s): PROBNP in the last 8760 hours. HbA1C: No results for input(s): HGBA1C in the last 72 hours. CBG: No results for input(s): GLUCAP in the last 168 hours. Lipid Profile: Recent Labs    05/05/20 1423  TRIG 95   Thyroid Function Tests: No results for input(s): TSH, T4TOTAL, FREET4, T3FREE, THYROIDAB in the last 72 hours. Anemia Panel: Recent Labs    05/06/20 0445 05/07/20 0518   FERRITIN 1,469* 1,059*   Sepsis Labs: Recent Labs  Lab 05/05/20 1028  PROCALCITON 0.17    Recent Results (from the past 240 hour(s))  Respiratory Panel by RT PCR (Flu A&B, Covid) - Nasopharyngeal Swab     Status: Abnormal   Collection Time: 05/05/20 11:09 AM   Specimen: Nasopharyngeal Swab  Result Value Ref Range Status   SARS Coronavirus 2 by RT PCR POSITIVE (A) NEGATIVE Final    Comment: RESULT CALLED TO, READ BACK BY AND VERIFIED WITH: KENDALL Pieper 05/05/20 1208 KLW (NOTE) SARS-CoV-2 target nucleic acids are DETECTED.  SARS-CoV-2 RNA is generally detectable in  upper respiratory specimens  during the acute phase of infection. Positive results are indicative of the presence of the identified virus, but do not rule out bacterial infection or co-infection with other pathogens not detected by the test. Clinical correlation with patient history and other diagnostic information is necessary to determine patient infection status. The expected result is Negative.  Fact Sheet for Patients:  PinkCheek.be  Fact Sheet for Healthcare Providers: GravelBags.it  This test is not yet approved or cleared by the [place] FDA and  has been authorized for detection and/or diagnosis of SARS-CoV-2 by FDA under an Emergency Use Authorization (EUA).  This EUA will remain in effect (meaning this test can be u sed) for the duration of  the COVID-19 declaration under Section 564(b)(1) of the Act, 21 U.S.C. section 360bbb-3(b)(1), unless the authorization is terminated or revoked sooner.      Influenza A by PCR NEGATIVE NEGATIVE Final   Influenza B by PCR NEGATIVE NEGATIVE Final    Comment: (NOTE) The Xpert Xpress SARS-CoV-2/FLU/RSV assay is intended as an aid in  the diagnosis of influenza from Nasopharyngeal swab specimens and  should not be used as a sole basis for treatment. Nasal washings and  aspirates are unacceptable  for Xpert Xpress SARS-CoV-2/FLU/RSV  testing.  Fact Sheet for Patients: PinkCheek.be  Fact Sheet for Healthcare Providers: GravelBags.it  This test is not yet approved or cleared by the [place] FDA and  has been authorized for detection and/or diagnosis of SARS-CoV-2 by  FDA under an Emergency Use Authorization (EUA). This EUA will remain  in effect (meaning this test can be used) for the duration of the  Covid-19 declaration under Section 564(b)(1) of the Act, 21  U.S.C. section 360bbb-3(b)(1), unless the authorization is  terminated or revoked. Performed at [facility], [address] [place], [place] [address]   Culture, blood (Routine X 2) w Reflex to ID Panel     Status: None (Preliminary result)   Collection Time: 05/05/20  2:23 PM   Specimen: BLOOD  Result Value Ref Range Status   Specimen Description BLOOD RIGHT ASSIST CONTROL  Final   Special Requests   Final    BOTTLES DRAWN AEROBIC AND ANAEROBIC Blood Culture adequate volume   Culture   Final    NO GROWTH < 24 HOURS Performed at [facility], [address], [place], [place] [address]    Report Status PENDING  Incomplete  Culture, blood (Routine X 2) w Reflex to ID Panel     Status: None (Preliminary result)   Collection Time: 05/05/20  2:30 PM   Specimen: BLOOD  Result Value Ref Range Status   Specimen Description BLOOD LEFT ASSIST CONTROL  Final   Special Requests   Final    BOTTLES DRAWN AEROBIC AND ANAEROBIC Blood Culture adequate volume   Culture   Final    NO GROWTH < 24 HOURS Performed at [facility], [address], [place], [place] [address]    Report Status PENDING  Incomplete         Radiology Studies: CT HEAD WO CONTRAST  Result Date: 05/05/2020 CLINICAL DATA:  69 year old male with weakness, fatigue, productive cough, COVID-19. EXAM: CT HEAD WITHOUT CONTRAST TECHNIQUE: Contiguous axial images were  obtained from the base of the skull through the vertex without intravenous contrast. COMPARISON:  None. FINDINGS: Brain: Cerebral volume is within normal limits for age. No midline shift, mass effect, or evidence of intracranial mass lesion. No ventriculomegaly. No acute intracranial  hemorrhage identified. 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 Perivascular spaces less likely. Largely normal for age gray-white matter differentiation elsewhere. No acute cortical infarct or chronic cortical encephalomalacia identified. Vascular: Mild Calcified atherosclerosis at the skull base. No suspicious intracranial vascular hyperdensity. Skull: No acute osseous abnormality identified. Sinuses/Orbits: Small fluid level with bubbly opacity in the right maxillary sinus. Minor ethmoid mucosal thickening. Other sinuses, the tympanic cavities and mastoids are well pneumatized. Other: No acute orbit or scalp soft tissue findings. Postoperative changes to the left globe. IMPRESSION: 1. No acute intracranial abnormality. 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 2. Acute right maxillary sinus inflammation. Electronically Signed   By: Genevie Ann M.D.   On: 05/05/2020 13:28   CT Angio Chest PE W and/or Wo Contrast  Result Date: 05/05/2020 CLINICAL DATA:  69 year old male with weakness, fatigue, productive cough. Treated for pneumonia in September with antibiotics. Subsequently diagnosed with COVID-19. EXAM: CT ANGIOGRAPHY CHEST WITH CONTRAST TECHNIQUE: Multidetector CT imaging of the chest was performed using the standard protocol during bolus administration of intravenous contrast. Multiplanar CT image reconstructions and MIPs were obtained to evaluate the vascular anatomy. CONTRAST:  76mL OMNIPAQUE IOHEXOL 350 MG/ML SOLN COMPARISON:  Chest radiographs earlier today. FINDINGS: Cardiovascular: Good contrast bolus timing in the pulmonary arterial tree. No focal filling defect  identified in the pulmonary arteries to suggest acute pulmonary embolism. Cardiac size at the upper limits of normal.  Negative visible aorta. Mediastinum/Nodes: Negative; upper limits of normal right carina and subcarinal lymph nodes. Lungs/Pleura: Bilateral lower lobe consolidation involving the posterior basal segments. Additional streaky opacity in the other lower lobe segments. Peripheral and peribronchial mostly ground-glass opacity in both upper lobes. Major airways remain patent. No pleural effusion. Upper Abdomen: Negative visible liver, gallbladder, spleen, pancreas, adrenal glands, kidneys, and bowel in the upper abdomen. Musculoskeletal: Degenerative vertebral endplate and costovertebral spurring at multiple levels. No acute or suspicious osseous lesion. Review of the MIP images confirms the above findings. IMPRESSION: 1. Negative for acute pulmonary embolus. 2. Widespread bilateral pneumonia typical in appearance for COVID-19. Some consolidation in both lower lobes. No pleural effusion. Electronically Signed   By: Genevie Ann M.D.   On: 05/05/2020 13:25   US Venous Img Lower Bilateral (DVT)  Result Date: 05/05/2020 CLINICAL DATA:  Positive D-dimer study.  Recent fall EXAM: BILATERAL LOWER EXTREMITY VENOUS DUPLEX ULTRASOUND TECHNIQUE: Gray-scale sonography with graded compression, as well as color Doppler and duplex ultrasound were performed to evaluate the lower extremity deep venous systems from the level of the common femoral vein and including the common femoral, femoral, profunda femoral, popliteal and calf veins including the posterior tibial, peroneal and gastrocnemius veins when visible. The superficial great saphenous vein was also interrogated. Spectral Doppler was utilized to evaluate flow at rest and with distal augmentation maneuvers in the common femoral, femoral and popliteal veins. COMPARISON:  None. FINDINGS: RIGHT LOWER EXTREMITY Common Femoral Vein: No evidence of thrombus. Normal  compressibility, respiratory phasicity and response to augmentation. Saphenofemoral Junction: No evidence of thrombus. Normal compressibility and flow on color Doppler imaging. Profunda Femoral Vein: No evidence of thrombus. Normal compressibility and flow on color Doppler imaging. Femoral Vein: No evidence of thrombus. Normal compressibility, respiratory phasicity and response to augmentation. Popliteal Vein: No evidence of thrombus. Normal compressibility, respiratory phasicity and response to augmentation. Calf Veins: No evidence of thrombus. Normal compressibility and flow on color Doppler imaging. Superficial Great Saphenous Vein: No evidence of thrombus. Normal compressibility. Venous  Reflux:  None. Other Findings:  Blood flow has a rouleaux type pattern. LEFT LOWER EXTREMITY Common Femoral Vein: No evidence of thrombus. Normal compressibility, respiratory phasicity and response to augmentation. Saphenofemoral Junction: No evidence of thrombus. Normal compressibility and flow on color Doppler imaging. Profunda Femoral Vein: No evidence of thrombus. Normal compressibility and flow on color Doppler imaging. Femoral Vein: No evidence of thrombus. Normal compressibility, respiratory phasicity and response to augmentation. Popliteal Vein: No evidence of thrombus. Normal compressibility, respiratory phasicity and response to augmentation. Calf Veins: No evidence of thrombus. Normal compressibility and flow on color Doppler imaging. Superficial Great Saphenous Vein: No evidence of thrombus. Normal compressibility. Venous Reflux:  None. Other Findings:  Blood flow as a rouleaux type pattern. IMPRESSION: No evidence of deep venous thrombosis in either lower extremity. Blood flow has a rouleaux type pattern bilaterally suggesting slow flow. Clinical significance of this finding uncertain. Electronically Signed   By: Lowella Grip III M.D.   On: 05/05/2020 14:44        Scheduled Meds: . vitamin C  500 mg Oral  Daily  . enoxaparin (LOVENOX) injection  40 mg Subcutaneous Q24H  . feeding supplement (ENSURE ENLIVE)  237 mL Oral TID BM  . ipratropium  2 puff Inhalation Q4H  . methylPREDNISolone (SOLU-MEDROL) injection  40 mg Intravenous Q12H  . multivitamin with minerals  1 tablet Oral Daily  . propranolol ER  60 mg Oral q morning - 10a  . zinc sulfate  220 mg Oral Daily   Continuous Infusions: . sodium chloride Stopped (05/06/20 1121)  . remdesivir 100 mg in NS 100 mL 100 mg (05/07/20 0832)     LOS: 2 days    Time spent: 25 minutes    Sidney Ace, MD [facility] Pager [phone]  If 7PM-7AM, please contact night-coverage 05/07/2020, 11:46 AM

## 2020-05-07 NOTE — Progress Notes (Signed)
Notified MD of 3.6 second pause was reported to me by tele.  Sinus brady 47.  It was reported to me by night nurse patient had pauses and dropped to HR 39

## 2020-05-07 NOTE — Plan of Care (Signed)

## 2020-05-08 LAB — COMPREHENSIVE METABOLIC PANEL
ALT: 45 U/L — ABNORMAL HIGH (ref 0–44)
AST: 29 U/L (ref 15–41)
Albumin: 2.8 g/dL — ABNORMAL LOW (ref 3.5–5.0)
Alkaline Phosphatase: 48 U/L (ref 38–126)
Anion gap: 11 (ref 5–15)
BUN: 35 mg/dL — ABNORMAL HIGH (ref 8–23)
CO2: 26 mmol/L (ref 22–32)
Calcium: 8.7 mg/dL — ABNORMAL LOW (ref 8.9–10.3)
Chloride: 98 mmol/L (ref 98–111)
Creatinine, Ser: 1.01 mg/dL (ref 0.61–1.24)
GFR, Estimated: >60 mL/min (ref >60)
Glucose, Bld: 174 mg/dL — ABNORMAL HIGH (ref 70–99)
Potassium: 4.3 mmol/L (ref 3.5–5.1)
Sodium: 135 mmol/L (ref 135–145)
Total Bilirubin: 0.7 mg/dL (ref 0.3–1.2)
Total Protein: 6.3 g/dL — ABNORMAL LOW (ref 6.5–8.1)

## 2020-05-08 LAB — CBC WITH DIFFERENTIAL/PLATELET
Abs Immature Granulocytes: 0.12 10*3/uL — ABNORMAL HIGH (ref 0.00–0.07)
Basophils Absolute: 0 10*3/uL (ref 0.0–0.1)
Basophils Relative: 0 %
Eosinophils Absolute: 0 10*3/uL (ref 0.0–0.5)
Eosinophils Relative: 0 %
HCT: 42.6 % (ref 39.0–52.0)
Hemoglobin: 14.3 g/dL (ref 13.0–17.0)
Immature Granulocytes: 1 %
Lymphocytes Relative: 4 %
Lymphs Abs: 0.6 10*3/uL — ABNORMAL LOW (ref 0.7–4.0)
MCH: 30.5 pg (ref 26.0–34.0)
MCHC: 33.6 g/dL (ref 30.0–36.0)
MCV: 90.8 fL (ref 80.0–100.0)
Monocytes Absolute: 0.7 10*3/uL (ref 0.1–1.0)
Monocytes Relative: 5 %
Neutro Abs: 12.7 10*3/uL — ABNORMAL HIGH (ref 1.7–7.7)
Neutrophils Relative %: 90 %
Platelets: 284 10*3/uL (ref 150–400)
RBC: 4.69 MIL/uL (ref 4.22–5.81)
RDW: 12.2 % (ref 11.5–15.5)
WBC: 14.1 10*3/uL — ABNORMAL HIGH (ref 4.0–10.5)
nRBC: 0 % (ref 0.0–0.2)

## 2020-05-08 LAB — FERRITIN: Ferritin: 864 ng/mL — ABNORMAL HIGH (ref 24–336)

## 2020-05-08 LAB — C-REACTIVE PROTEIN: CRP: 2.8 mg/dL — ABNORMAL HIGH (ref <1.0)

## 2020-05-08 LAB — FIBRIN DERIVATIVES D-DIMER (ARMC ONLY): Fibrin derivatives D-dimer (ARMC): 1311.85 ng/mL (FEU) — ABNORMAL HIGH (ref 0.00–499.00)

## 2020-05-08 LAB — MAGNESIUM: Magnesium: 2.5 mg/dL — ABNORMAL HIGH (ref 1.7–2.4)

## 2020-05-08 MED ORDER — FUROSEMIDE 10 MG/ML IJ SOLN
40.0000 mg | Freq: Once | INTRAMUSCULAR | Status: AC
  Administered 2020-05-08: 12:00:00 40 mg via INTRAVENOUS
  Filled 2020-05-08: qty 4

## 2020-05-08 NOTE — Progress Notes (Signed)
PROGRESS NOTE    Joseph Parsons  JTT:[id number] DOB: 15-Dec-1950 DOA: 05/05/2020 PCP: Pa, [facility]   Brief Narrative: Joseph Parsons is a 69 y.o. male with medical history significant of essential tremor, who presents with cough, shortness breath, syncope.  Pt states that he has been having cough and shortness breath for several weeks.  He was recently diagnosed with pneumonia 3 weeks ago and finished a course of antibiotics.  He states that he continues to have cough and shortness of breath.  He was tested positive for Covid19 one week ago.  Patient denies fever or chills.  No chest pain.  Patient has intermittent diarrhea, denies nausea, vomiting, abdominal pain.  No symptoms of UTI.  Patient states that he was fully vaccinated against COVID-19. Patient states that he has been feeling weak. He has lack of energy and poor appetite. Patient states that he passed out yesterday  Oxygen requirement now decreased down to 6 L from a maximum of 12.  Patient clinically appears well  Assessment & Plan:   Principal Problem:   Pneumonia due to COVID-19 virus Active Problems:   Syncope and collapse   Fall   Acute respiratory failure with hypoxia (HCC)   Essential tremor  Acute respiratory failure with hypoxia due to pneumonia due to COVID-19 virus: Oxygen desaturation to 85% on room air, improved to 93 on 3 L nasal cannula oxygen.  Chest x-ray is positive for bilateral patchy infiltration.  CT angiogram negative for PE.  Lower extremity Doppler negative for DVT. -Currently on 6 L high flow nasal cannula Plan: -Remdesivir per pharm, dose 4/5 -Solumedrol 40 mg bid, dose 4/10 -vitamin C, zinc.  -Bronchodilators -PRN Mucinex for cough -f/u Blood culture, no growth to date -Lasix 40 mg IV x1.  Reassess daily for diuretic need and tolerance -Prone as tolerated.  Stress I-S use  Syncope and collapse and fall No focal neurologic deficit on physical examination.   CT head  negative for acute intracranial abnormalities.   Patient is alert, but answers questions slowly. No focal deficits to indicate possible CVA Not orthostatic Plan: Therapy evaluations Hold MRI brain at this time Treat Covid as above  Essential tremor -continue inderal   DVT prophylaxis: Lovenox Code Status: Full Family Communication: None today.  Offered to call family but patient declined Disposition Plan: Status is: Inpatient  Remains inpatient appropriate because:Inpatient level of care appropriate due to severity of illness   Dispo: The patient is from: Home              Anticipated d/c is to: Home              Anticipated d/c date is: 2 days              Patient currently is not medically stable to d/c.   Still hypoxic requiring 6 L high flow nasal cannula.  Improving over interval.  Anticipate 1-2 additional days of inpatient treatment monitoring prior to disposition planning.  Will attempt to wean entirely from supplemental oxygen.  If we are able to get down to 2 L and patient remained stable will discharge with home oxygen 2 L.  Consultants:   None  Procedures:   None  Antimicrobials:   Remdesivir   Subjective: Seen and examined.  Cough persistent. shortness of breath improved Objective: Vitals:   05/07/20 2341 05/08/20 0634 05/08/20 0733 05/08/20 1153  BP: 123/72 122/75 122/80 129/79  Pulse: 67 (!) 57 (!) 54 65  Resp:  16 16 20 18   Temp: 98.8 F (37.1 C) 97.8 F (36.6 C) 97.7 F (36.5 C) 97.7 F (36.5 C)  TempSrc: Oral Oral Oral Oral  SpO2: 92% (!) 89% 97% 94%  Weight:      Height:        Intake/Output Summary (Last 24 hours) at 05/08/2020 1206 Last data filed at 05/08/2020 0555 Gross per 24 hour  Intake 120 ml  Output 1325 ml  Net -1205 ml   Filed Weights   05/05/20 1010  Weight: 86.2 kg    Examination:  General exam: Appears calm and comfortable  Respiratory system: Bibasilar crackles.  Normal work of breathing.  6 L  cardiovascular  system: S1 & S2 heard, RRR. No JVD, murmurs, rubs, gallops or clicks. No pedal edema. Gastrointestinal system: Abdomen is nondistended, soft and nontender. No organomegaly or masses felt. Normal bowel sounds heard. Central nervous system: Alert and oriented.  No focal deficits.  Extremities: Symmetric 5 x 5 power. Skin: No rashes, lesions or ulcers Psychiatry: Judgement and insight appear normal. Mood & affect flattened.     Data Reviewed: I have personally reviewed following labs and imaging studies  CBC: Recent Labs  Lab 05/05/20 1013 05/06/20 0445 05/07/20 0518 05/08/20 0324  WBC 9.7 5.8 12.4* 14.1*  NEUTROABS  --  5.0 11.1* 12.7*  HGB 15.9 15.1 14.6 14.3  HCT 47.7 44.3 43.1 42.6  MCV 91.2 91.3 90.7 90.8  PLT 211 211 256 283   Basic Metabolic Panel: Recent Labs  Lab 05/05/20 1013 05/05/20 1021 05/06/20 0445 05/07/20 0518 05/08/20 0324  NA 135  --  138 135 135  K 3.9  --  5.0 4.5 4.3  CL 98  --  101 100 98  CO2 24  --  27 28 26   GLUCOSE 114*  --  152* 171* 174*  BUN 28*  --  27* 35* 35*  CREATININE 1.07  --  1.18 1.05 1.01  CALCIUM 8.5*  --  8.9 8.5* 8.7*  MG  --  2.7* 2.7* 2.6* 2.5*   GFR: Estimated Creatinine Clearance: 72.3 mL/min (by C-G formula based on SCr of 1.01 mg/dL). Liver Function Tests: Recent Labs  Lab 05/05/20 1013 05/06/20 0445 05/07/20 0518 05/08/20 0324  AST 46* 37 36 29  ALT 40 38 45* 45*  ALKPHOS 49 47 49 48  BILITOT 1.4* 0.9 0.9 0.7  PROT 7.1 6.8 6.6 6.3*  ALBUMIN 3.2* 2.9* 2.8* 2.8*   No results for input(s): LIPASE, AMYLASE in the last 168 hours. No results for input(s): AMMONIA in the last 168 hours. Coagulation Profile: No results for input(s): INR, PROTIME in the last 168 hours. Cardiac Enzymes: No results for input(s): CKTOTAL, CKMB, CKMBINDEX, TROPONINI in the last 168 hours. BNP (last 3 results) No results for input(s): PROBNP in the last 8760 hours. HbA1C: No results for input(s): HGBA1C in the last 72 hours. CBG: No  results for input(s): GLUCAP in the last 168 hours. Lipid Profile: Recent Labs    05/05/20 1423  TRIG 95   Thyroid Function Tests: No results for input(s): TSH, T4TOTAL, FREET4, T3FREE, THYROIDAB in the last 72 hours. Anemia Panel: Recent Labs    05/07/20 0518 05/08/20 0324  FERRITIN 1,059* 864*   Sepsis Labs: Recent Labs  Lab 05/05/20 1028  PROCALCITON 0.17    Recent Results (from the past 240 hour(s))  Respiratory Panel by RT PCR (Flu A&B, Covid) - Nasopharyngeal Swab     Status: Abnormal   Collection Time: 05/05/20  11:09 AM   Specimen: Nasopharyngeal Swab  Result Value Ref Range Status   SARS Coronavirus 2 by RT PCR POSITIVE (A) NEGATIVE Final    Comment: RESULT CALLED TO, READ BACK BY AND VERIFIED WITH: KENDALL Lennox 05/05/20 1208 KLW (NOTE) SARS-CoV-2 target nucleic acids are DETECTED.  SARS-CoV-2 RNA is generally detectable in upper respiratory specimens  during the acute phase of infection. Positive results are indicative of the presence of the identified virus, but do not rule out bacterial infection or co-infection with other pathogens not detected by the test. Clinical correlation with patient history and other diagnostic information is necessary to determine patient infection status. The expected result is Negative.  Fact Sheet for Patients:  PinkCheek.be  Fact Sheet for Healthcare Providers: GravelBags.it  This test is not yet approved or cleared by the [place] FDA and  has been authorized for detection and/or diagnosis of SARS-CoV-2 by FDA under an Emergency Use Authorization (EUA).  This EUA will remain in effect (meaning this test can be u sed) for the duration of  the COVID-19 declaration under Section 564(b)(1) of the Act, 21 U.S.C. section 360bbb-3(b)(1), unless the authorization is terminated or revoked sooner.      Influenza A by PCR NEGATIVE NEGATIVE Final   Influenza B by  PCR NEGATIVE NEGATIVE Final    Comment: (NOTE) The Xpert Xpress SARS-CoV-2/FLU/RSV assay is intended as an aid in  the diagnosis of influenza from Nasopharyngeal swab specimens and  should not be used as a sole basis for treatment. Nasal washings and  aspirates are unacceptable for Xpert Xpress SARS-CoV-2/FLU/RSV  testing.  Fact Sheet for Patients: PinkCheek.be  Fact Sheet for Healthcare Providers: GravelBags.it  This test is not yet approved or cleared by the [place] FDA and  has been authorized for detection and/or diagnosis of SARS-CoV-2 by  FDA under an Emergency Use Authorization (EUA). This EUA will remain  in effect (meaning this test can be used) for the duration of the  Covid-19 declaration under Section 564(b)(1) of the Act, 21  U.S.C. section 360bbb-3(b)(1), unless the authorization is  terminated or revoked. Performed at [facility], [address] [place], [place] [address]   Culture, blood (Routine X 2) w Reflex to ID Panel     Status: None (Preliminary result)   Collection Time: 05/05/20  2:23 PM   Specimen: BLOOD  Result Value Ref Range Status   Specimen Description BLOOD RIGHT ASSIST CONTROL  Final   Special Requests   Final    BOTTLES DRAWN AEROBIC AND ANAEROBIC Blood Culture adequate volume   Culture   Final    NO GROWTH 3 DAYS Performed at [facility], [address], [place], [place] [address]    Report Status PENDING  Incomplete  Culture, blood (Routine X 2) w Reflex to ID Panel     Status: None (Preliminary result)   Collection Time: 05/05/20  2:30 PM   Specimen: BLOOD  Result Value Ref Range Status   Specimen Description BLOOD LEFT ASSIST CONTROL  Final   Special Requests   Final    BOTTLES DRAWN AEROBIC AND ANAEROBIC Blood Culture adequate volume   Culture   Final    NO GROWTH 3 DAYS Performed at [facility], [address] [place], [place]  [address]    Report Status PENDING  Incomplete         Radiology Studies: No results found.      Scheduled Meds: . vitamin C  500 mg  Oral Daily  . enoxaparin (LOVENOX) injection  40 mg Subcutaneous Q24H  . feeding supplement (ENSURE ENLIVE)  237 mL Oral TID BM  . furosemide  40 mg Intravenous Once  . ipratropium  2 puff Inhalation Q4H  . methylPREDNISolone (SOLU-MEDROL) injection  40 mg Intravenous Q12H  . multivitamin with minerals  1 tablet Oral Daily  . propranolol ER  60 mg Oral q morning - 10a  . zinc sulfate  220 mg Oral Daily   Continuous Infusions: . sodium chloride Stopped (05/06/20 1121)  . remdesivir 100 mg in NS 100 mL 100 mg (05/08/20 0906)     LOS: 3 days    Time spent: 25 minutes    Sidney Ace, MD [facility] Pager [phone]  If 7PM-7AM, please contact night-coverage 05/08/2020, 12:06 PM

## 2020-05-09 ENCOUNTER — Encounter: Payer: Self-pay | Admitting: Internal Medicine

## 2020-05-09 LAB — COMPREHENSIVE METABOLIC PANEL
ALT: 41 U/L (ref 0–44)
AST: 22 U/L (ref 15–41)
Albumin: 3 g/dL — ABNORMAL LOW (ref 3.5–5.0)
Alkaline Phosphatase: 48 U/L (ref 38–126)
Anion gap: 10 (ref 5–15)
BUN: 31 mg/dL — ABNORMAL HIGH (ref 8–23)
CO2: 27 mmol/L (ref 22–32)
Calcium: 8.7 mg/dL — ABNORMAL LOW (ref 8.9–10.3)
Chloride: 99 mmol/L (ref 98–111)
Creatinine, Ser: 1.02 mg/dL (ref 0.61–1.24)
GFR, Estimated: >60 mL/min (ref >60)
Glucose, Bld: 185 mg/dL — ABNORMAL HIGH (ref 70–99)
Potassium: 4.3 mmol/L (ref 3.5–5.1)
Sodium: 136 mmol/L (ref 135–145)
Total Bilirubin: 0.7 mg/dL (ref 0.3–1.2)
Total Protein: 6.3 g/dL — ABNORMAL LOW (ref 6.5–8.1)

## 2020-05-09 LAB — FERRITIN: Ferritin: 800 ng/mL — ABNORMAL HIGH (ref 24–336)

## 2020-05-09 LAB — CBC WITH DIFFERENTIAL/PLATELET
Abs Immature Granulocytes: 0.21 10*3/uL — ABNORMAL HIGH (ref 0.00–0.07)
Basophils Absolute: 0 10*3/uL (ref 0.0–0.1)
Basophils Relative: 0 %
Eosinophils Absolute: 0 10*3/uL (ref 0.0–0.5)
Eosinophils Relative: 0 %
HCT: 44.7 % (ref 39.0–52.0)
Hemoglobin: 15 g/dL (ref 13.0–17.0)
Immature Granulocytes: 2 %
Lymphocytes Relative: 6 %
Lymphs Abs: 0.7 10*3/uL (ref 0.7–4.0)
MCH: 30.5 pg (ref 26.0–34.0)
MCHC: 33.6 g/dL (ref 30.0–36.0)
MCV: 91 fL (ref 80.0–100.0)
Monocytes Absolute: 0.8 10*3/uL (ref 0.1–1.0)
Monocytes Relative: 7 %
Neutro Abs: 9.8 10*3/uL — ABNORMAL HIGH (ref 1.7–7.7)
Neutrophils Relative %: 85 %
Platelets: 289 10*3/uL (ref 150–400)
RBC: 4.91 MIL/uL (ref 4.22–5.81)
RDW: 12 % (ref 11.5–15.5)
WBC: 11.6 10*3/uL — ABNORMAL HIGH (ref 4.0–10.5)
nRBC: 0 % (ref 0.0–0.2)

## 2020-05-09 LAB — FIBRIN DERIVATIVES D-DIMER (ARMC ONLY): Fibrin derivatives D-dimer (ARMC): 896.51 ng/mL (FEU) — ABNORMAL HIGH (ref 0.00–499.00)

## 2020-05-09 LAB — C-REACTIVE PROTEIN: CRP: 1.3 mg/dL — ABNORMAL HIGH (ref <1.0)

## 2020-05-09 LAB — MAGNESIUM: Magnesium: 2.4 mg/dL (ref 1.7–2.4)

## 2020-05-09 MED ORDER — FUROSEMIDE 10 MG/ML IJ SOLN
40.0000 mg | Freq: Once | INTRAMUSCULAR | Status: AC
  Administered 2020-05-09: 40 mg via INTRAVENOUS
  Filled 2020-05-09: qty 4

## 2020-05-09 NOTE — Progress Notes (Signed)
PROGRESS NOTE    Joseph Parsons  SNK:[id number] DOB: 11-22-1950 DOA: 05/05/2020 PCP: Pa, [facility]   Brief Narrative: Joseph Parsons is a 69 y.o. male with medical history significant of essential tremor, who presents with cough, shortness breath, syncope.  Pt states that he has been having cough and shortness breath for several weeks.  He was recently diagnosed with pneumonia 3 weeks ago and finished a course of antibiotics.  He states that he continues to have cough and shortness of breath.  He was tested positive for Covid19 one week ago.  Patient denies fever or chills.  No chest pain.  Patient has intermittent diarrhea, denies nausea, vomiting, abdominal pain.  No symptoms of UTI.  Patient states that he was fully vaccinated against COVID-19. Patient states that he has been feeling weak. He has lack of energy and poor appetite. Patient states that he passed out yesterday  Oxygen requirement now decreased down to 6 L from a maximum of 12.  Patient clinically appears well.  Unable to wean from 6 L  Assessment & Plan:   Principal Problem:   Pneumonia due to COVID-19 virus Active Problems:   Syncope and collapse   Fall   Acute respiratory failure with hypoxia (HCC)   Essential tremor  Acute respiratory failure with hypoxia due to pneumonia due to COVID-19 virus: Oxygen desaturation to 85% on room air, improved to 93 on 3 L nasal cannula oxygen.  Chest x-ray is positive for bilateral patchy infiltration.  CT angiogram negative for PE.  Lower extremity Doppler negative for DVT. -Currently on 6 L high flow nasal cannula Plan: -Remdesivir per pharm, dose 5/5 -Solumedrol 40 mg bid, dose 5/10 -vitamin C, zinc.  -Bronchodilators -PRN Mucinex for cough -f/u Blood culture, no growth to date -Lasix 40 mg IV x1.  Reassess daily for diuretic need and tolerance -Prone as tolerated.  Stress I-S use  Syncope and collapse and fall No focal neurologic deficit on physical  examination.   CT head negative for acute intracranial abnormalities.   Patient is alert, but answers questions slowly. No focal deficits to indicate possible CVA Not orthostatic Plan: Therapy evaluations, home health PT No indication for MRI Treat Covid as above  Essential tremor -continue inderal   DVT prophylaxis: Lovenox Code Status: Full Family Communication: None today.  Offered to call family but patient declined Disposition Plan: Status is: Inpatient  Remains inpatient appropriate because:Inpatient level of care appropriate due to severity of illness   Dispo: The patient is from: Home              Anticipated d/c is to: Home              Anticipated d/c date is: 2 days              Patient currently is not medically stable to d/c.   Still hypoxic requiring 6 L high flow nasal cannula.  Improving over interval.  Anticipate 1-2 additional days of inpatient treatment monitoring prior to disposition planning.  Will attempt to wean entirely from supplemental oxygen.  If we are able to get down to 2 L and patient remained stable will discharge with home oxygen 2 L.  Consultants:   None  Procedures:   None  Antimicrobials:   Remdesivir   Subjective: Seen and examined.   Cough persistent. shortness of breath improved Objective: Vitals:   05/08/20 2341 05/09/20 0433 05/09/20 0835 05/09/20 1317  BP: 126/87 130/89 106/84 105/67  Pulse: 60 (!) 52 66 (!) 57  Resp: 20 18 20    Temp: 98 F (36.7 C) 97.9 F (36.6 C) 98.7 F (37.1 C) 97.6 F (36.4 C)  TempSrc: Oral Oral Oral Oral  SpO2: 97% 95% (!) 89% 98%  Weight:      Height:        Intake/Output Summary (Last 24 hours) at 05/09/2020 1338 Last data filed at 05/09/2020 1316 Gross per 24 hour  Intake 817.82 ml  Output 2000 ml  Net -1182.18 ml   Filed Weights   05/05/20 1010  Weight: 86.2 kg    Examination:  General exam: Appears calm and comfortable  Respiratory system: Bibasilar crackles.  Normal  work of breathing.  6 L  cardiovascular system: S1 & S2 heard, RRR. No JVD, murmurs, rubs, gallops or clicks. No pedal edema. Gastrointestinal system: Abdomen is nondistended, soft and nontender. No organomegaly or masses felt. Normal bowel sounds heard. Central nervous system: Alert and oriented.  No focal deficits.  Extremities: Symmetric 5 x 5 power. Skin: No rashes, lesions or ulcers Psychiatry: Judgement and insight appear normal. Mood & affect flattened.     Data Reviewed: I have personally reviewed following labs and imaging studies  CBC: Recent Labs  Lab 05/05/20 1013 05/06/20 0445 05/07/20 0518 05/08/20 0324 05/09/20 0503  WBC 9.7 5.8 12.4* 14.1* 11.6*  NEUTROABS  --  5.0 11.1* 12.7* 9.8*  HGB 15.9 15.1 14.6 14.3 15.0  HCT 47.7 44.3 43.1 42.6 44.7  MCV 91.2 91.3 90.7 90.8 91.0  PLT 211 211 256 284 588   Basic Metabolic Panel: Recent Labs  Lab 05/05/20 1013 05/05/20 1021 05/06/20 0445 05/07/20 0518 05/08/20 0324 05/09/20 0503  NA 135  --  138 135 135 136  K 3.9  --  5.0 4.5 4.3 4.3  CL 98  --  101 100 98 99  CO2 24  --  27 28 26 27   GLUCOSE 114*  --  152* 171* 174* 185*  BUN 28*  --  27* 35* 35* 31*  CREATININE 1.07  --  1.18 1.05 1.01 1.02  CALCIUM 8.5*  --  8.9 8.5* 8.7* 8.7*  MG  --  2.7* 2.7* 2.6* 2.5* 2.4   GFR: Estimated Creatinine Clearance: 71.6 mL/min (by C-G formula based on SCr of 1.02 mg/dL). Liver Function Tests: Recent Labs  Lab 05/05/20 1013 05/06/20 0445 05/07/20 0518 05/08/20 0324 05/09/20 0503  AST 46* 37 36 29 22  ALT 40 38 45* 45* 41  ALKPHOS 49 47 49 48 48  BILITOT 1.4* 0.9 0.9 0.7 0.7  PROT 7.1 6.8 6.6 6.3* 6.3*  ALBUMIN 3.2* 2.9* 2.8* 2.8* 3.0*   No results for input(s): LIPASE, AMYLASE in the last 168 hours. No results for input(s): AMMONIA in the last 168 hours. Coagulation Profile: No results for input(s): INR, PROTIME in the last 168 hours. Cardiac Enzymes: No results for input(s): CKTOTAL, CKMB, CKMBINDEX,  TROPONINI in the last 168 hours. BNP (last 3 results) No results for input(s): PROBNP in the last 8760 hours. HbA1C: No results for input(s): HGBA1C in the last 72 hours. CBG: No results for input(s): GLUCAP in the last 168 hours. Lipid Profile: No results for input(s): CHOL, HDL, LDLCALC, TRIG, CHOLHDL, LDLDIRECT in the last 72 hours. Thyroid Function Tests: No results for input(s): TSH, T4TOTAL, FREET4, T3FREE, THYROIDAB in the last 72 hours. Anemia Panel: Recent Labs    05/08/20 0324 05/09/20 0503  FERRITIN 864* 800*   Sepsis Labs: Recent Labs  Lab 05/05/20 1028  PROCALCITON 0.17    Recent Results (from the past 240 hour(s))  Respiratory Panel by RT PCR (Flu A&B, Covid) - Nasopharyngeal Swab     Status: Abnormal   Collection Time: 05/05/20 11:09 AM   Specimen: Nasopharyngeal Swab  Result Value Ref Range Status   SARS Coronavirus 2 by RT PCR POSITIVE (A) NEGATIVE Final    Comment: RESULT CALLED TO, READ BACK BY AND VERIFIED WITH: KENDALL Rayfield 05/05/20 1208 KLW (NOTE) SARS-CoV-2 target nucleic acids are DETECTED.  SARS-CoV-2 RNA is generally detectable in upper respiratory specimens  during the acute phase of infection. Positive results are indicative of the presence of the identified virus, but do not rule out bacterial infection or co-infection with other pathogens not detected by the test. Clinical correlation with patient history and other diagnostic information is necessary to determine patient infection status. The expected result is Negative.  Fact Sheet for Patients:  PinkCheek.be  Fact Sheet for Healthcare Providers: GravelBags.it  This test is not yet approved or cleared by the [place] FDA and  has been authorized for detection and/or diagnosis of SARS-CoV-2 by FDA under an Emergency Use Authorization (EUA).  This EUA will remain in effect (meaning this test can be u sed) for the duration  of  the COVID-19 declaration under Section 564(b)(1) of the Act, 21 U.S.C. section 360bbb-3(b)(1), unless the authorization is terminated or revoked sooner.      Influenza A by PCR NEGATIVE NEGATIVE Final   Influenza B by PCR NEGATIVE NEGATIVE Final    Comment: (NOTE) The Xpert Xpress SARS-CoV-2/FLU/RSV assay is intended as an aid in  the diagnosis of influenza from Nasopharyngeal swab specimens and  should not be used as a sole basis for treatment. Nasal washings and  aspirates are unacceptable for Xpert Xpress SARS-CoV-2/FLU/RSV  testing.  Fact Sheet for Patients: PinkCheek.be  Fact Sheet for Healthcare Providers: GravelBags.it  This test is not yet approved or cleared by the [place] FDA and  has been authorized for detection and/or diagnosis of SARS-CoV-2 by  FDA under an Emergency Use Authorization (EUA). This EUA will remain  in effect (meaning this test can be used) for the duration of the  Covid-19 declaration under Section 564(b)(1) of the Act, 21  U.S.C. section 360bbb-3(b)(1), unless the authorization is  terminated or revoked. Performed at [facility], [address] [place], [place] [address]   Culture, blood (Routine X 2) w Reflex to ID Panel     Status: None (Preliminary result)   Collection Time: 05/05/20  2:23 PM   Specimen: BLOOD  Result Value Ref Range Status   Specimen Description BLOOD RIGHT ASSIST CONTROL  Final   Special Requests   Final    BOTTLES DRAWN AEROBIC AND ANAEROBIC Blood Culture adequate volume   Culture   Final    NO GROWTH 4 DAYS Performed at [facility], [address], [place], [place] [address]    Report Status PENDING  Incomplete  Culture, blood (Routine X 2) w Reflex to ID Panel     Status: None (Preliminary result)   Collection Time: 05/05/20  2:30 PM   Specimen: BLOOD  Result Value Ref Range Status   Specimen Description BLOOD LEFT ASSIST  CONTROL  Final   Special Requests   Final    BOTTLES DRAWN AEROBIC AND ANAEROBIC Blood Culture adequate volume   Culture   Final    NO GROWTH 4 DAYS Performed at [facility], [place]  [address] [place], [place] [address]    Report Status PENDING  Incomplete         Radiology Studies: No results found.      Scheduled Meds: . vitamin C  500 mg Oral Daily  . enoxaparin (LOVENOX) injection  40 mg Subcutaneous Q24H  . feeding supplement (ENSURE ENLIVE)  237 mL Oral TID BM  . ipratropium  2 puff Inhalation Q4H  . methylPREDNISolone (SOLU-MEDROL) injection  40 mg Intravenous Q12H  . multivitamin with minerals  1 tablet Oral Daily  . propranolol ER  60 mg Oral q morning - 10a  . zinc sulfate  220 mg Oral Daily   Continuous Infusions: . sodium chloride Stopped (05/09/20 1313)     LOS: 4 days    Time spent: 25 minutes    Sidney Ace, MD [facility] Pager [phone]  If 7PM-7AM, please contact night-coverage 05/09/2020, 1:38 PM

## 2020-05-09 NOTE — Care Management Important Message (Signed)
Important Message  Patient Details  Name: DELANDO SATTER MRN: [id number] Date of Birth: [birth year]   Medicare Important Message Given:  Yes  Reviewed with patient via room phone due to isolation status.  Declined copy of Medicare IM at this time as one being sent via mail to home address by Patient [facility].     Dannette Barbara 05/09/2020, 1:24 PM

## 2020-05-09 NOTE — Progress Notes (Signed)
Physical Therapy Treatment Patient Details Name: Joseph Parsons MRN: [id number] DOB: [birth year] Today's Date: 05/09/2020    History of Present Illness 69 yo male admitted with acute respiratory failure with hypoxia due to pneumonia due to COVID-19 virus. PMHx includes essential tremor.    PT Comments    Pt received in Semi-Fowler's position and agreeable to therapy.  SpO2 saturation monitored with all positional changes and exercises performed.  Pt was able to perform bed-level exercises without much difficulty.  Pt did note having BLE stiffness from being in hospital and was encouraged to continue performing exercises in order to keep strength and decrease stiffness.  Pt was then able to come seated EOB with supervision.  SpO2 sat stayed in 88-94% range throughout exercise bout.  Pt performed ambulation within room require therapist for navigating room with IV pole and telemetry.  Pt SpO2 remained >88% with ambulation.  Upon sitting, pt SpO2 dropped to 80% and was instructed on pursed-lip breathing and saturation returning to 98% within 5 seconds.  Pt educated on importance of utilizing pursed-lip breathing technique in order to utilize supplemental oxygen provided.  Pt agreed and has good carryover.  Current discharge plans to home with HHPT remain appropriate at this time.  Pt will continue to benefit from skilled therapy in order to address deficits listed below.     Follow Up Recommendations  Home health PT     Equipment Recommendations  Rolling walker with 5" wheels;3in1 (PT)    Recommendations for Other Services       Precautions / Restrictions Precautions Precautions: Fall Precaution Comments: watch O2 sats Restrictions Weight Bearing Restrictions: No    Mobility  Bed Mobility Overal bed mobility: Needs Assistance Bed Mobility: Supine to Sit;Sit to Supine     Supine to sit: Supervision Sit to supine: Supervision      Transfers Overall transfer level: Needs  assistance Equipment used: None Transfers: Sit to/from Stand Sit to Stand: Supervision         General transfer comment: Smooth transfers today with no LOB.  Ambulation/Gait Ambulation/Gait assistance: Min assist Gait Distance (Feet): 80 Feet Assistive device: None Gait Pattern/deviations: Step-through pattern Gait velocity: decreased   General Gait Details: Pt with much better ambulation and O2 stats today compared to previous session.  Required minA for navigation in room and maneuvering IV pole/telemetry.   Stairs             Wheelchair Mobility    Modified Rankin (Stroke Patients Only)       Balance Overall balance assessment: Needs assistance Sitting-balance support: No upper extremity supported;Feet supported Sitting balance-Leahy Scale: Good     Standing balance support: Single extremity supported Standing balance-Leahy Scale: Good Standing balance comment: Pt able to stand without UE support and perform standing marched without LOB.                            Cognition Arousal/Alertness: Awake/alert Behavior During Therapy: WFL for tasks assessed/performed Overall Cognitive Status: Within Functional Limits for tasks assessed                                        Exercises Total Joint Exercises Ankle Circles/Pumps: AROM;Strengthening;Both;10 reps;Supine Quad Sets: AROM;Strengthening;Both;10 reps;Supine Gluteal Sets: AROM;Strengthening;Both;10 reps;Supine Heel Slides: AROM;Strengthening;Both;10 reps;Supine Hip ABduction/ADduction: AROM;Strengthening;Both;10 reps;Supine Straight Leg Raises: AROM;Strengthening;Both;10 reps;Supine Long Arc Quad: AROM;Strengthening;Both;10 reps;Seated Marching  in Standing: AROM;Strengthening;Both;10 reps;Standing    General Comments General comments (skin integrity, edema, etc.): 4L of O2, 88-94% sats throughout exercises and ambulation.  Upon sitting EOB following exercises, desat to 80%,  rose to 98% with PLB within 5 seconds.      Pertinent Vitals/Pain Pain Assessment: No/denies pain    Home Living                      Prior Function            PT Goals (current goals can now be found in the care plan section) Progress towards PT goals: Progressing toward goals    Frequency    Min 2X/week      PT Plan      Co-evaluation              AM-PAC PT "6 Clicks" Mobility   Outcome Measure  Help needed turning from your back to your side while in a flat bed without using bedrails?: A Little Help needed moving from lying on your back to sitting on the side of a flat bed without using bedrails?: A Little Help needed moving to and from a bed to a chair (including a wheelchair)?: A Little Help needed standing up from a chair using your arms (e.g., wheelchair or bedside chair)?: A Little Help needed to walk in hospital room?: A Little Help needed climbing 3-5 steps with a railing? : A Lot 6 Click Score: 17    End of Session Equipment Utilized During Treatment: Oxygen Activity Tolerance: Patient tolerated treatment well Patient left: in bed Nurse Communication: Mobility status PT Visit Diagnosis: Unsteadiness on feet (R26.81);Other abnormalities of gait and mobility (R26.89);Difficulty in walking, not elsewhere classified (R26.2)     Time: 1202-1227 PT Time Calculation (min) (ACUTE ONLY): 25 min  Charges:  $Gait Training: 8-22 mins $Therapeutic Exercise: 8-22 mins                     Gwenlyn Saran, PT, DPT 05/09/20, 2:19 PM

## 2020-05-09 NOTE — TOC Initial Note (Deleted)
Transition of Care [facility]) - Initial/Assessment Note    Patient Details  Name: Joseph Parsons MRN: [id number] Date of Birth: 11/22/1950  Transition of Care [facility]) CM/SW Contact:    Izola Price, RN Phone Number: 05/09/2020, 11:22 AM  Clinical Narrative:                   Expected Discharge Plan: Home/Self Care Barriers to Discharge: Continued Medical Work up   Patient Goals and CMS Choice Patient states their goals for this hospitalization and ongoing recovery are:: Get home and back to work      Expected Discharge Plan and Services Expected Discharge Plan: Home/Self Care   Discharge Planning Services: CM Consult                       DME Agency: [facility]       HH Arranged: RN, PT, OT [facility] Agency: [facility] Date [place]: 05/09/20 Time [place]: 1121 Representative spoke with at [place]: [name]  Prior Living Arrangements/Services   Lives with:: Self Patient language and need for interpreter reviewed:: Yes Do you feel safe going back to the place where you live?: Yes      Need for Family Participation in Patient Care:  (Pt lives alone, mother is 71 lives in [place], no other contacts given.) Care giver support system in place?: No (comment) (Lives alone, mother 29 in [place]. No real contat info given for ride home.)   Criminal Activity/Legal Involvement Pertinent to Current Situation/Hospitalization: No - Comment as needed  Activities of Daily Living Home Assistive Devices/Equipment: None ADL Screening (condition at time of admission) Patient's cognitive ability adequate to safely complete daily activities?: Yes Is the patient deaf or have difficulty hearing?: No Does the patient have difficulty seeing, even when wearing glasses/contacts?: No Does the patient have difficulty concentrating, remembering, or making decisions?: Yes Patient able to express need for assistance with ADLs?: Yes Does the patient have difficulty  dressing or bathing?: No Independently performs ADLs?: Yes (appropriate for developmental age) Does the patient have difficulty walking or climbing stairs?: No Weakness of Legs: Both Weakness of Arms/Hands: None  Permission Sought/Granted Permission sought to share information with : Case Manager, Customer service manager, Family Supports Permission granted to share information with : Yes, Verbal Permission Granted  Share Information with NAME: [name] (mother 92) [phone]  Permission granted to share info w AGENCY: [facility] granted to share info w Relationship: Mother  Permission granted to share info w Contact Information: [phone]  Emotional Assessment   Attitude/Demeanor/Rapport: Engaged Affect (typically observed): Accepting Orientation: : Oriented to Self, Oriented to Place, Oriented to  Time, Oriented to Situation Alcohol / Substance Use: Not Applicable Psych Involvement: No (comment)  Admission diagnosis:  Syncope and collapse [R55] Positive D dimer [R79.89] Acute respiratory failure with hypoxia (HCC) [J96.01] Forearm laceration, left, initial encounter [S51.812A] Pneumonia due to COVID-19 virus [U07.1, J12.82] COVID-19 [U07.1] Patient Active Problem List   Diagnosis Date Noted  . Pneumonia due to COVID-19 virus 05/05/2020  . Syncope and collapse 05/05/2020  . Fall 05/05/2020  . Acute respiratory failure with hypoxia ([place]) 05/05/2020  . Essential tremor    PCP:  Pa, [facility] Pharmacy:   [facility] [place] [place] [address] Phone: [phone] Fax: [phone]  Social Determinants of Health (SDOH) Interventions    Readmission Risk Interventions No flowsheet data found.

## 2020-05-09 NOTE — TOC Initial Note (Addendum)
Transition of Care [facility]) - Initial/Assessment Note    Patient Details  Name: Joseph Parsons MRN: [id number] Date of Birth: 19-Jun-1951  Transition of Care [facility]) CM/SW Contact:    Joseph Price, RN Phone Number: 05/09/2020, 11:12 AM  Clinical Narrative:    10/11 1115 Spoke with patient over the phone. Lives alone. Independent. No Prior DME/HH services. Address confirmed. Rx confirmed as [facility]. No real emergency contacts other than 22 yo mother in [place]. Will try to find a ride home. Work contact given as [facility] [phone] as last choice, but not to share health info. PCP confirmed Dr. Marin [facility] at [facility]. HH PT/OT recommended. Call into to [facility] at [facility]; pt stated no preference. May need transportation assist at time of discharge in 1-2 days depending on oxygen needs and if he can find a ride--was going to use [facility]. Will try to find a ride home.  Continue to monitor situation. Joseph Davies RN CM               Expected Discharge Plan: Home/Self Care Barriers to Discharge: Continued Medical Work up   Patient Goals and CMS Choice Patient states their goals for this hospitalization and ongoing recovery are:: Get home and back to work      Expected Discharge Plan and Services Expected Discharge Plan: Home/Self Care   Discharge Planning Services: CM Consult                       DME Agency: [facility]                  Prior Living Arrangements/Services   Lives with:: Self Patient language and need for interpreter reviewed:: Yes Do you feel safe going back to the place where you live?: Yes      Need for Family Participation in Patient Care:  (Pt lives alone, mother is 67 lives in [place], no other contacts given.) Care giver support system in place?: No (Parsons) (Lives alone, mother 58 in [place]. No real contat info given for ride home.)   Criminal Activity/Legal Involvement Pertinent to Current Situation/Hospitalization: No - Parsons as  needed  Activities of Daily Living Home Assistive Devices/Equipment: None ADL Screening (condition at time of admission) Patient's cognitive ability adequate to safely complete daily activities?: Yes Is the patient deaf or have difficulty hearing?: No Does the patient have difficulty seeing, even when wearing glasses/contacts?: No Does the patient have difficulty concentrating, remembering, or making decisions?: Yes Patient able to express need for assistance with ADLs?: Yes Does the patient have difficulty dressing or bathing?: No Independently performs ADLs?: Yes (appropriate for developmental age) Does the patient have difficulty walking or climbing stairs?: No Weakness of Legs: Both Weakness of Arms/Hands: None  Permission Sought/Granted Permission sought to share information with : Case Manager, Customer service manager, Family Supports Permission granted to share information with : Yes, Verbal Permission Granted  Share Information with NAME: Joseph [facility] (mother 92) [phone]     Permission granted to share info w Relationship: Mother  Permission granted to share info w Contact Information: [phone]  Emotional Assessment   Attitude/Demeanor/Rapport: Engaged Affect (typically observed): Accepting Orientation: : Oriented to Self, Oriented to Place, Oriented to  Time, Oriented to Situation Alcohol / Substance Use: Not Applicable Psych Involvement: No (Parsons)  Admission diagnosis:  Syncope and collapse [R55] Positive D dimer [R79.89] Acute respiratory failure with hypoxia (HCC) [J96.01] Forearm laceration, left, initial encounter [S51.812A] Pneumonia due to  COVID-19 virus [U07.1, J12.82] COVID-19 [U07.1] Patient Active Problem List   Diagnosis Date Noted  . Pneumonia due to COVID-19 virus 05/05/2020  . Syncope and collapse 05/05/2020  . Fall 05/05/2020  . Acute respiratory failure with hypoxia ([facility]) 05/05/2020  . Essential tremor    PCP:  Pa, [facility] Pharmacy:   [facility], [place] [facility] [address] [place] [place] [address] Phone: [phone] Fax: [phone]     Social Determinants of Health (SDOH) Interventions    Readmission Risk Interventions No flowsheet data found.

## 2020-05-09 NOTE — Progress Notes (Signed)
Occupational Therapy Treatment Patient Details Name: Joseph Parsons MRN: [id number] DOB: 16-Oct-1950 Today's Date: 05/09/2020    History of present illness 69 yo male admitted with acute respiratory failure with hypoxia due to pneumonia due to COVID-19 virus. PMHx includes essential tremor.   OT comments  Pt seen for OT treatment on this date. Upon arrival to room pt awake, alert, seated upright in bed, A&O x 4, and reporting no pain. Pt agreeable to tx. Mod I with bed mobility. Pt instructed in UB bathing, grooming, standing at sink with CGA. Pt reported he was not feeling SOB, although O2 sats dropped to 80% with activity. Pt returned to bed, ambulating with CGA, instructed in deep breathing techniques. O2 sats returned to mid-90s w/in 3-4 minutes. Provided education re: slow and steady approach to recovery following pt's inquiry about how soon he could get back to gym. Pt making good progress toward goals and will continue to benefit from skilled OT services to maximize return to PLOF.  Discharge recommendation remains appropriate.    Follow Up Recommendations  Home health OT    Equipment Recommendations  None recommended by OT    Recommendations for Other Services      Precautions / Restrictions Precautions Precautions: Fall Precaution Comments: watch O2 sats Restrictions Weight Bearing Restrictions: No       Mobility Bed Mobility Overal bed mobility: Needs Assistance Bed Mobility: Supine to Sit;Sit to Supine     Supine to sit: Supervision Sit to supine: Supervision      Transfers Overall transfer level: Needs assistance Equipment used: None Transfers: Sit to/from Stand Sit to Stand: Min guard         General transfer comment: Had to encourage pt to transition a bit more slowly; he was slightly impulsive and displayed little awareness of possibility of O2 desat    Balance Overall balance assessment: Needs assistance Sitting-balance support: No upper extremity  supported;Feet supported Sitting balance-Leahy Scale: Good     Standing balance support: No upper extremity supported Standing balance-Leahy Scale: Good Standing balance comment: Pt able to stand without UE support and perform grooming/bathing tasks without LOB.                           ADL either performed or assessed with clinical judgement   ADL Overall ADL's : Needs assistance/impaired     Grooming: Min guard   Upper Body Bathing: Min guard                           Functional mobility during ADLs: Min guard General ADL Comments: Performed grooming, UB bathing in standing at sink, but desat to 80% O2     Vision Baseline Vision/History: Wears glasses Patient Visual Report: No change from baseline     Perception     Praxis      Cognition Arousal/Alertness: Awake/alert Behavior During Therapy: WFL for tasks assessed/performed Overall Cognitive Status: Within Functional Limits for tasks assessed                                          Exercises Total Joint Exercises Ankle Circles/Pumps: AROM;Strengthening;Both;10 reps;Supine Quad Sets: AROM;Strengthening;Both;10 reps;Supine Gluteal Sets: AROM;Strengthening;Both;10 reps;Supine Heel Slides: AROM;Strengthening;Both;10 reps;Supine Hip ABduction/ADduction: AROM;Strengthening;Both;10 reps;Supine Straight Leg Raises: AROM;Strengthening;Both;10 reps;Supine Long Arc Quad: AROM;Strengthening;Both;10 reps;Seated Marching in Standing: AROM;Strengthening;Both;10 reps;Standing  Other Exercises Other Exercises: Pt educated on breathing techniques, energy conservation, activity pacing, post-COVID recovery   Shoulder Instructions       General Comments 4 L of O2, with 90% or > sats, but dropped to 80% with exertion    Pertinent Vitals/ Pain       Pain Assessment: No/denies pain  Home Living                                          Prior Functioning/Environment               Frequency  Min 2X/week        Progress Toward Goals  OT Goals(current goals can now be found in the care plan section)  Progress towards OT goals: Progressing toward goals  Acute Rehab OT Goals Patient Stated Goal: breathe better and go home OT Goal Formulation: With patient Time For Goal Achievement: 05/20/20 Potential to Achieve Goals: Good  Plan Discharge plan remains appropriate    Co-evaluation                 AM-PAC OT "6 Clicks" Daily Activity     Outcome Measure   Help from another person eating meals?: None Help from another person taking care of personal grooming?: A Little Help from another person toileting, which includes using toliet, bedpan, or urinal?: A Little Help from another person bathing (including washing, rinsing, drying)?: A Little Help from another person to put on and taking off regular upper body clothing?: None Help from another person to put on and taking off regular lower body clothing?: A Little 6 Click Score: 20    End of Session    OT Visit Diagnosis: Other abnormalities of gait and mobility (R26.89);History of falling (Z91.81)   Activity Tolerance Patient tolerated treatment well   Patient Left in bed;with call bell/phone within reach;with bed alarm set   Nurse Communication          Time: 7622-6333 OT Time Calculation (min): 14 min  Charges: OT General Charges $OT Visit: 1 Visit OT Treatments $Self Care/Home Management : 8-22 mins  Josiah Lobo, PhD, [place], OTR/L ascom [phone] 05/09/20, 4:31 PM

## 2020-05-10 LAB — CBC WITH DIFFERENTIAL/PLATELET
Abs Immature Granulocytes: 0.68 10*3/uL — ABNORMAL HIGH (ref 0.00–0.07)
Basophils Absolute: 0 10*3/uL (ref 0.0–0.1)
Basophils Relative: 0 %
Eosinophils Absolute: 0 10*3/uL (ref 0.0–0.5)
Eosinophils Relative: 0 %
HCT: 45.6 % (ref 39.0–52.0)
Hemoglobin: 15.6 g/dL (ref 13.0–17.0)
Immature Granulocytes: 6 %
Lymphocytes Relative: 8 %
Lymphs Abs: 0.9 10*3/uL (ref 0.7–4.0)
MCH: 30.8 pg (ref 26.0–34.0)
MCHC: 34.2 g/dL (ref 30.0–36.0)
MCV: 90.1 fL (ref 80.0–100.0)
Monocytes Absolute: 0.9 10*3/uL (ref 0.1–1.0)
Monocytes Relative: 7 %
Neutro Abs: 9.7 10*3/uL — ABNORMAL HIGH (ref 1.7–7.7)
Neutrophils Relative %: 79 %
Platelets: 353 10*3/uL (ref 150–400)
RBC: 5.06 MIL/uL (ref 4.22–5.81)
RDW: 12.2 % (ref 11.5–15.5)
Smear Review: NORMAL
WBC: 12.3 10*3/uL — ABNORMAL HIGH (ref 4.0–10.5)
nRBC: 0 % (ref 0.0–0.2)

## 2020-05-10 LAB — COMPREHENSIVE METABOLIC PANEL
ALT: 44 U/L (ref 0–44)
AST: 24 U/L (ref 15–41)
Albumin: 2.9 g/dL — ABNORMAL LOW (ref 3.5–5.0)
Alkaline Phosphatase: 50 U/L (ref 38–126)
Anion gap: 9 (ref 5–15)
BUN: 31 mg/dL — ABNORMAL HIGH (ref 8–23)
CO2: 29 mmol/L (ref 22–32)
Calcium: 8.9 mg/dL (ref 8.9–10.3)
Chloride: 98 mmol/L (ref 98–111)
Creatinine, Ser: 0.99 mg/dL (ref 0.61–1.24)
GFR, Estimated: >60 mL/min (ref >60)
Glucose, Bld: 157 mg/dL — ABNORMAL HIGH (ref 70–99)
Potassium: 4.5 mmol/L (ref 3.5–5.1)
Sodium: 136 mmol/L (ref 135–145)
Total Bilirubin: 0.8 mg/dL (ref 0.3–1.2)
Total Protein: 6.2 g/dL — ABNORMAL LOW (ref 6.5–8.1)

## 2020-05-10 LAB — CULTURE, BLOOD (ROUTINE X 2)
Culture: NO GROWTH
Culture: NO GROWTH
Special Requests: ADEQUATE
Special Requests: ADEQUATE

## 2020-05-10 LAB — FERRITIN: Ferritin: 719 ng/mL — ABNORMAL HIGH (ref 24–336)

## 2020-05-10 LAB — MAGNESIUM: Magnesium: 2.6 mg/dL — ABNORMAL HIGH (ref 1.7–2.4)

## 2020-05-10 LAB — C-REACTIVE PROTEIN: CRP: 0.9 mg/dL (ref <1.0)

## 2020-05-10 LAB — FIBRIN DERIVATIVES D-DIMER (ARMC ONLY): Fibrin derivatives D-dimer (ARMC): 761.57 ng/mL (FEU) — ABNORMAL HIGH (ref 0.00–499.00)

## 2020-05-10 MED ORDER — FUROSEMIDE 10 MG/ML IJ SOLN
40.0000 mg | Freq: Once | INTRAMUSCULAR | Status: AC
  Administered 2020-05-10: 40 mg via INTRAVENOUS
  Filled 2020-05-10: qty 4

## 2020-05-10 NOTE — Progress Notes (Signed)
SATURATION QUALIFICATIONS: (This note is used to comply with regulatory documentation for home oxygen)  Patient Saturations on Room Air at Rest = 91%  Patient Saturations on Room Air while Ambulating = 86%  Patient Saturations on 6 Liters of oxygen while Ambulating = 91%  Please briefly explain why patient needs home oxygen:  Had to increase o2 from 2L to 6L while walking to maintain  02 saturation

## 2020-05-10 NOTE — Progress Notes (Signed)
PROGRESS NOTE    Joseph Parsons  TWS:[id number] DOB: 1951/02/29 DOA: 05/05/2020 PCP: [facility], [facility]   Brief Narrative: Joseph Parsons is a 69 y.o. male with medical history significant of essential tremor, who presents with cough, shortness breath, syncope.  Pt states that he has been having cough and shortness breath for several weeks.  He was recently diagnosed with pneumonia 3 weeks ago and finished a course of antibiotics.  He states that he continues to have cough and shortness of breath.  He was tested positive for Covid19 one week ago.  Patient denies fever or chills.  No chest pain.  Patient has intermittent diarrhea, denies nausea, vomiting, abdominal pain.  No symptoms of UTI.  Patient states that he was fully vaccinated against COVID-19. Patient states that he has been feeling weak. He has lack of energy and poor appetite. Patient states that he passed out yesterday  Oxygen requirement tapered to 3 L nasal cannula.  Patient reports his energy level is improving and shortness of breath is resolved.  Assessment & Plan:   Principal Problem:   Pneumonia due to COVID-19 virus Active Problems:   Syncope and collapse   Fall   Acute respiratory failure with hypoxia (HCC)   Essential tremor  Acute respiratory failure with hypoxia due to pneumonia due to COVID-19 virus: Oxygen desaturation to 85% on room air, improved to 93 on 3 L nasal cannula oxygen.  Chest x-ray is positive for bilateral patchy infiltration.  CT angiogram negative for PE.  Lower extremity Doppler negative for DVT. -Currently on 3 L nasal cannula Plan: -Completed 5-day course of remdesivir -Continue Solumedrol 40 mg bid, dose 6/10 -vitamin C, zinc.  -Bronchodilators -PRN Mucinex for cough -f/u Blood culture, no growth to date -Lasix 40 mg IV x1.  Reassess daily for diuretic need and tolerance -Prone as tolerated.  Stress I-S use -We will plan to monitor for 24 additional hours.  Will attempt  to wean from supplemental oxygen.  Reevaluate 05/11/2020 for discharge.  Will ambulate patient today and likely again tomorrow morning to assess his degree of desaturation and need for home oxygen  Syncope and collapse and fall No focal neurologic deficit on physical examination.   CT head negative for acute intracranial abnormalities.   Patient is alert, but answers questions slowly. No focal deficits to indicate possible CVA Not orthostatic Plan: Therapy evaluations, home health PT No indication for MRI Treat Covid as above  Essential tremor -continue inderal   DVT prophylaxis: Lovenox Code Status: Full Family Communication: None today.  Offered to call family but patient declined Disposition Plan: Status is: Inpatient  Remains inpatient appropriate because:Inpatient level of care appropriate due to severity of illness   Dispo: The patient is from: Home              Anticipated d/c is to: Home              Anticipated d/c date is: 1 day              Patient currently is not medically stable to d/c.   Still hypoxic wearing 3 L nasal cannula.  Improving over interval.  Anticipated date of discharge 05/11/2020.  Will attempt to wean from submental oxygen entirely prior to discharge.  Consultants:   None  Procedures:   None  Antimicrobials:   Remdesivir   Subjective: Seen and examined.   Cough improved.  Shortness of breath only evident upon exertion  Objective: Vitals:  05/10/20 0521 05/10/20 0743 05/10/20 0759 05/10/20 0849  BP: 115/80 102/88    Pulse: (!) 51 (!) 53 62   Resp: 18 19    Temp: 98 F (36.7 C) 98.2 F (36.8 C)    TempSrc: Oral Oral    SpO2: 96% 92%  92%  Weight:      Height:        Intake/Output Summary (Last 24 hours) at 05/10/2020 1037 Last data filed at 05/10/2020 0617 Gross per 24 hour  Intake 817.82 ml  Output 1850 ml  Net -1032.18 ml   Filed Weights   05/05/20 1010  Weight: 86.2 kg    Examination:  General exam:  Appears calm and comfortable  Respiratory system: Mild bilateral crackles.  Normal work of breathing.  3 L  cardiovascular system: S1 & S2 heard, RRR. No JVD, murmurs, rubs, gallops or clicks. No pedal edema. Gastrointestinal system: Abdomen is nondistended, soft and nontender. No organomegaly or masses felt. Normal bowel sounds heard. Central nervous system: Alert and oriented.  No focal deficits.  Extremities: Symmetric 5 x 5 power. Skin: No rashes, lesions or ulcers Psychiatry: Judgement and insight appear normal. Mood & affect flattened.     Data Reviewed: I have personally reviewed following labs and imaging studies  CBC: Recent Labs  Lab 05/06/20 0445 05/07/20 0518 05/08/20 0324 05/09/20 0503 05/10/20 0525  WBC 5.8 12.4* 14.1* 11.6* 12.3*  NEUTROABS 5.0 11.1* 12.7* 9.8* 9.7*  HGB 15.1 14.6 14.3 15.0 15.6  HCT 44.3 43.1 42.6 44.7 45.6  MCV 91.3 90.7 90.8 91.0 90.1  PLT 211 256 284 289 716   Basic Metabolic Panel: Recent Labs  Lab 05/06/20 0445 05/07/20 0518 05/08/20 0324 05/09/20 0503 05/10/20 0525  NA 138 135 135 136 136  K 5.0 4.5 4.3 4.3 4.5  CL 101 100 98 99 98  CO2 27 28 26 27 29   GLUCOSE 152* 171* 174* 185* 157*  BUN 27* 35* 35* 31* 31*  CREATININE 1.18 1.05 1.01 1.02 0.99  CALCIUM 8.9 8.5* 8.7* 8.7* 8.9  MG 2.7* 2.6* 2.5* 2.4 2.6*   GFR: Estimated Creatinine Clearance: 73.7 mL/min (by C-G formula based on SCr of 0.99 mg/dL). Liver Function Tests: Recent Labs  Lab 05/06/20 0445 05/07/20 0518 05/08/20 0324 05/09/20 0503 05/10/20 0525  AST 37 36 29 22 24   ALT 38 45* 45* 41 44  ALKPHOS 47 49 48 48 50  BILITOT 0.9 0.9 0.7 0.7 0.8  PROT 6.8 6.6 6.3* 6.3* 6.2*  ALBUMIN 2.9* 2.8* 2.8* 3.0* 2.9*   No results for input(s): LIPASE, AMYLASE in the last 168 hours. No results for input(s): AMMONIA in the last 168 hours. Coagulation Profile: No results for input(s): INR, PROTIME in the last 168 hours. Cardiac Enzymes: No results for input(s): CKTOTAL,  CKMB, CKMBINDEX, TROPONINI in the last 168 hours. BNP (last 3 results) No results for input(s): PROBNP in the last 8760 hours. HbA1C: No results for input(s): HGBA1C in the last 72 hours. CBG: No results for input(s): GLUCAP in the last 168 hours. Lipid Profile: No results for input(s): CHOL, HDL, LDLCALC, TRIG, CHOLHDL, LDLDIRECT in the last 72 hours. Thyroid Function Tests: No results for input(s): TSH, T4TOTAL, FREET4, T3FREE, THYROIDAB in the last 72 hours. Anemia Panel: Recent Labs    05/09/20 0503 05/10/20 0525  FERRITIN 800* 719*   Sepsis Labs: Recent Labs  Lab 05/05/20 1028  PROCALCITON 0.17    Recent Results (from the past 240 hour(s))  Respiratory Panel by RT PCR (  Flu A&B, Covid) - Nasopharyngeal Swab     Status: Abnormal   Collection Time: 05/05/20 11:09 AM   Specimen: Nasopharyngeal Swab  Result Value Ref Range Status   SARS Coronavirus 2 by RT PCR POSITIVE (A) NEGATIVE Final    Comment: RESULT CALLED TO, READ BACK BY AND VERIFIED WITH: KENDALL Paolella 05/05/20 1208 KLW (NOTE) SARS-CoV-2 target nucleic acids are DETECTED.  SARS-CoV-2 RNA is generally detectable in upper respiratory specimens  during the acute phase of infection. Positive results are indicative of the presence of the identified virus, but do not rule out bacterial infection or co-infection with other pathogens not detected by the test. Clinical correlation with patient history and other diagnostic information is necessary to determine patient infection status. The expected result is Negative.  Fact Sheet for Patients:  PinkCheek.be  Fact Sheet for Healthcare Providers: GravelBags.it  This test is not yet approved or cleared by the [place] FDA and  has been authorized for detection and/or diagnosis of SARS-CoV-2 by FDA under an Emergency Use Authorization (EUA).  This EUA will remain in effect (meaning this test can be u sed)  for the duration of  the COVID-19 declaration under Section 564(b)(1) of the Act, 21 U.S.C. section 360bbb-3(b)(1), unless the authorization is terminated or revoked sooner.      Influenza A by PCR NEGATIVE NEGATIVE Final   Influenza B by PCR NEGATIVE NEGATIVE Final    Comment: (NOTE) The Xpert Xpress SARS-CoV-2/FLU/RSV assay is intended as an aid in  the diagnosis of influenza from Nasopharyngeal swab specimens and  should not be used as a sole basis for treatment. Nasal washings and  aspirates are unacceptable for Xpert Xpress SARS-CoV-2/FLU/RSV  testing.  Fact Sheet for Patients: PinkCheek.be  Fact Sheet for Healthcare Providers: GravelBags.it  This test is not yet approved or cleared by the [place] FDA and  has been authorized for detection and/or diagnosis of SARS-CoV-2 by  FDA under an Emergency Use Authorization (EUA). This EUA will remain  in effect (meaning this test can be used) for the duration of the  Covid-19 declaration under Section 564(b)(1) of the Act, 21  U.S.C. section 360bbb-3(b)(1), unless the authorization is  terminated or revoked. Performed at [facility], [address] [place], [place] [address]   Culture, blood (Routine X 2) w Reflex to ID Panel     Status: None   Collection Time: 05/05/20  2:23 PM   Specimen: BLOOD  Result Value Ref Range Status   Specimen Description BLOOD RIGHT ASSIST CONTROL  Final   Special Requests   Final    BOTTLES DRAWN AEROBIC AND ANAEROBIC Blood Culture adequate volume   Culture   Final    NO GROWTH 5 DAYS Performed at [facility], [address], [place], [place] [address]    Report Status 05/10/2020 FINAL  Final  Culture, blood (Routine X 2) w Reflex to ID Panel     Status: None   Collection Time: 05/05/20  2:30 PM   Specimen: BLOOD  Result Value Ref Range Status   Specimen Description BLOOD LEFT ASSIST CONTROL  Final   Special  Requests   Final    BOTTLES DRAWN AEROBIC AND ANAEROBIC Blood Culture adequate volume   Culture   Final    NO GROWTH 5 DAYS Performed at [facility], [address], [place], [place] [address]    Report Status 05/10/2020 FINAL  Final         Radiology Studies: No  results found.      Scheduled Meds: . vitamin C  500 mg Oral Daily  . enoxaparin (LOVENOX) injection  40 mg Subcutaneous Q24H  . feeding supplement (ENSURE ENLIVE)  237 mL Oral TID BM  . ipratropium  2 puff Inhalation Q4H  . methylPREDNISolone (SOLU-MEDROL) injection  40 mg Intravenous Q12H  . multivitamin with minerals  1 tablet Oral Daily  . propranolol ER  60 mg Oral q morning - 10a  . zinc sulfate  220 mg Oral Daily   Continuous Infusions: . sodium chloride Stopped (05/09/20 1313)     LOS: 5 days    Time spent: 25 minutes    Sidney Ace, MD [facility] Pager [phone]  If 7PM-7AM, please contact night-coverage 05/10/2020, 10:37 AM

## 2020-05-10 NOTE — Progress Notes (Signed)
Occupational Therapy Treatment Patient Details Name: Joseph Parsons MRN: [id number] DOB: 01-May-1951 Today's Date: 05/10/2020    History of present illness 69 yo male admitted with acute respiratory failure with hypoxia due to pneumonia due to COVID-19 virus. PMHx includes essential tremor.   OT comments  Pt seen for OT treatment on this date. Upon arrival to room pt awake, A&O x4, reporting no pain. On 2L O2 Joseph Parsons today. Pt participated in TherEx, first seated EOB, with no drop in O2; then in standing. After ~ 3 minutes of marching/stepping, pt's O2 levels dropped to 88%. Recovered into mid-90s with ~ 3 minutes of return to sitting. Pt educated about how to build back his exercise program gradually when he returns home, paying attention to his breathing and how he is feeling. Pt verbalized understanding. Pt making good progress toward goals and can continue to benefit from skilled OT services to maximize return to PLOF. Will continue to follow POC. Discharge recommendation remains appropriate.   Follow Up Recommendations       Equipment Recommendations  None recommended by OT    Recommendations for Other Services      Precautions / Restrictions Precautions Precautions: Fall Precaution Comments: watch O2 sats Restrictions Weight Bearing Restrictions: No       Mobility Bed Mobility Overal bed mobility: Needs Assistance Bed Mobility: Supine to Sit;Sit to Supine     Supine to sit: Supervision Sit to supine: Supervision   General bed mobility comments: no performance delays today --  moving more smoothly and faster than during previous sessions  Transfers Overall transfer level: Needs assistance Equipment used: None Transfers: Sit to/from Stand Sit to Stand: Min guard              Balance Overall balance assessment: Needs assistance Sitting-balance support: No upper extremity supported;Feet supported Sitting balance-Leahy Scale: Good     Standing balance support: No  upper extremity supported Standing balance-Leahy Scale: Good                             ADL either performed or assessed with clinical judgement   ADL Overall ADL's : Needs assistance/impaired     Grooming: Min guard                               Functional mobility during ADLs: Min guard       Vision Baseline Vision/History: Wears glasses Patient Visual Report: No change from baseline     Perception     Praxis      Cognition Arousal/Alertness: Awake/alert Behavior During Therapy: WFL for tasks assessed/performed Overall Cognitive Status: Within Functional Limits for tasks assessed                                          Exercises Other Exercises Other Exercises: Engaged pt in therex seated EOB and then in standing. O2 sats stayed in >90% with sitting, dropped to <90% with exertion in standing. Educated re: recovery process, HEP   Shoulder Instructions       General Comments 2 L of O2 with 96% seated EOB, dropping to 89% with exertion    Pertinent Vitals/ Pain          Home Living  Prior Functioning/Environment              Frequency  Min 2X/week        Progress Toward Goals  OT Goals(current goals can now be found in the care plan section)  Progress towards OT goals: Progressing toward goals  Acute Rehab OT Goals Patient Stated Goal: breathe better and go home OT Goal Formulation: With patient Time For Goal Achievement: 05/20/20 Potential to Achieve Goals: Good  Plan Discharge plan remains appropriate    Co-evaluation                 AM-PAC OT "6 Clicks" Daily Activity     Outcome Measure   Help from another person eating meals?: None Help from another person taking care of personal grooming?: A Little Help from another person toileting, which includes using toliet, bedpan, or urinal?: A Little Help from another person bathing  (including washing, rinsing, drying)?: A Little Help from another person to put on and taking off regular upper body clothing?: None Help from another person to put on and taking off regular lower body clothing?: A Little 6 Click Score: 20    End of Session    OT Visit Diagnosis: Other abnormalities of gait and mobility (R26.89);History of falling (Z91.81)   Activity Tolerance Patient tolerated treatment well   Patient Left in bed;with call bell/phone within reach   Nurse Communication          Time: 9242-6834 OT Time Calculation (min): 26 min  Charges: OT General Charges $OT Visit: 1 Visit OT Treatments $Therapeutic Exercise: 23-37 mins  Josiah Lobo, PhD, [place], OTR/L ascom [phone] 05/10/20, 4:31 PM

## 2020-05-11 DIAGNOSIS — G250 Essential tremor: Secondary | ICD-10-CM

## 2020-05-11 MED ORDER — DEXAMETHASONE 6 MG PO TABS: 6.0000 mg | ORAL_TABLET | Freq: Every day | ORAL | 0 refills | Status: AC

## 2020-05-11 NOTE — TOC Transition Note (Signed)
Transition of Care [facility]) - CM/SW Discharge Note   Patient Details  Name: HARUO STEPANEK MRN: [id number] Date of Birth: 04/14/1951  [facility]) CM/SW Contact:  Shelbie Hutching, RN Phone Number: 05/11/2020, 2:51 PM   Clinical Narrative:    Patient needs a ride home.  RNCM set up transport with [facility] and they will take patient home.  Transport will contact the patient and the RN when they arrive.    Final next level of care: [place] Barriers to Discharge: Barriers Resolved   Patient Goals and CMS Choice Patient states their goals for this hospitalization and ongoing recovery are:: Get home and back to work [employer].gov Compare Post Acute Care list provided to:: Patient Choice offered to / list presented to : Patient  Discharge Placement                       Discharge Plan and Services   Discharge Planning Services: CM Consult              DME Agency: [facility]       HH Arranged: PT, OT [facility] Agency: [facility] [place] Date [facility] Agency Contacted: 05/11/20 Time [place]: [phone] Representative spoke with at [place]: [place] Determinants of Health ([place]) Interventions     Readmission Risk Interventions No flowsheet data found.

## 2020-05-11 NOTE — TOC Transition Note (Signed)
Transition of Care [facility]) - CM/SW Discharge Note   Patient Details  Name: Joseph Parsons MRN: [id number] Date of Birth: 1950-12-21  Transition of Care [facility]) CM/SW Contact:  Shelbie Hutching, RN Phone Number: 05/11/2020, 9:43 AM   Clinical Narrative:    Patient is medically cleared for discharge home with home heath services.  Jana Half with [facility] notified of discharge home today.  Patient does not want a walker or bedside commode.  Patient reports that he does have transportation home and just needs to let his ride know what time to come and pick him up.  Bedside RN will notify patient on when to call his ride.     Final next level of care: [place] Barriers to Discharge: Barriers Resolved   Patient Goals and CMS Choice Patient states their goals for this hospitalization and ongoing recovery are:: Get home and back to work [employer].gov Compare Post Acute Care list provided to:: Patient Choice offered to / list presented to : Patient  Discharge Placement                       Discharge Plan and Services   Discharge Planning Services: CM Consult              DME Agency: [facility]       HH Arranged: PT, OT [facility] Agency: [facility] [place] Date [facility] Agency Contacted: 05/11/20 Time [place]: [phone] Representative spoke with at [place]: [place] Determinants of Health ([place]) Interventions     Readmission Risk Interventions No flowsheet data found.

## 2020-05-11 NOTE — Discharge Summary (Signed)
Physician Discharge Summary  Joseph ESCHER KGU:[id number] DOB: [birth year] DOA: 05/05/2020  PCP: Tiajuana Amass [facility]  Admit date: 05/05/2020 Discharge date: 05/11/2020  Admitted From: Home Disposition: Home   Recommendations for Outpatient Follow-up:  1. Follow up with PCP in 1-2 weeks. 2. Will need suture removal from left forearm/wrist area in no more than 1 week from discharge.  3. Monitor HR/BP with some bradycardia noted on propranolol.  Home Health: None  Equipment/Devices: None, does not need supplemental oxygen Discharge Condition: Stable CODE STATUS: Full Diet recommendation: Heart healthy  Brief/Interim Summary: Joseph Sprung Flanaganis a 69 y.o.malewith medical history significant ofessential tremor,who presented with cough, shortness breath, syncope.  Pt states thathe has been having coughandshortness breath for several weeks. He was recently diagnosed with pneumonia 3 weeks ago and finished a course of antibiotics. He states that he continues to have cough andshortness of breath. He was tested positive for Covid19 oneweek ago.Patient denies fever or chills. No chest pain. Patient has intermittent diarrhea,denies nausea, vomiting, abdominal pain. No symptoms of UTI. Patient states that he was fully vaccinated against COVID-19.Patient states that he has been feelingweak. He haslack of energyandpoor appetite.Patient states that he passed out PTA.  Oxygen requirement tapered to 3 L nasal cannula and ultimately weaned to room air after Tx with remdesivir and steroids. See below for details.  Discharge Diagnoses:  Principal Problem:   Pneumonia due to COVID-19 virus Active Problems:   Syncope and collapse   Fall   Acute respiratory failure with hypoxia (HCC)   Essential tremor  Acute hypoxemic respiratory failure due to covid-19 pneumonia: SARS-CoV-2 testing reportedly positive 9/27, confirmed on 10/7 by PCR. Completed remdesivir x5 days.  Reportedly vaccinated.  - Hypoxia resolved even with exertion around the unit on morning of discharge.  - Complete 10 days steroids with decadron as outpatient.  - Encourage OOB, IS at home. - Continue airborne, contact precautions for 21 days from positive testing.  Left arm laceration: Sutures placed at admission and wound with normal healing in progress at time of discharge. No evidence of infection, though wound is not closed, so suture removal is deferred to PCP. This was discussed with the patient.   Syncope and collapseand fall: No focal neurological deficits. CT head negative for acute intracranial abnormalities. Negative orthostatics. This happened right during/after urination, so micturition syncope is also probable.  - Home PT - If recurrent, consider outpatient monitoring and decrease propranolol dosing.  Sinus bradycardia with rare sinus pauses: Pauses noted on 10/9 with poor concomitant waveforms. No significant pauses since that time despite continuing propranolol. No symptomatic bradycardia noted.  - Follow up with PCP, again, consider monitoring and/or decrease propranolol if becomes symptomatic.   Essential tremor:  - Continue propranolol at current dose  Discharge Instructions  Allergies as of 05/11/2020   No Known Allergies     Medication List    TAKE these medications   albuterol 108 (90 Base) MCG/ACT inhaler Commonly known as: VENTOLIN HFA Inhale 2 puffs into the lungs every 4 (four) hours as needed.   dexamethasone 6 MG tablet Commonly known as: Decadron Take 1 tablet (6 mg total) by mouth daily for 3 days.   propranolol ER 60 MG 24 hr capsule Commonly known as: INDERAL LA Take 60 mg by mouth every morning.            Durable Medical Equipment  (From admission, onward)         Start     Ordered  05/07/20 0845  For home use only DME Walker rolling  Once       Question Answer Comment  Walker: With Maize Wheels   Patient needs a walker to  treat with the following condition Weakness      05/07/20 0844   05/07/20 0845  For home use only DME 3 n 1  Once        05/07/20 0844          Follow-up Information    [facility], [facility]. Schedule an appointment as soon as possible for a visit.   Contact information: [place] [address] [phone]              No Known Allergies  Consultations:  None  Procedures/Studies: DG Chest 2 View  Result Date: 05/05/2020 CLINICAL DATA:  Productive cough. EXAM: CHEST - 2 VIEW COMPARISON[facility]  [facility] FINDINGS: Interval development of patchy basilar predominant airspace disease, left greater than right. No pleural effusion. Cardiopericardial silhouette is at upper limits of normal for size. The visualized bony structures of the thorax show no acute abnormality. IMPRESSION: Patchy bilateral basilar predominant airspace disease compatible with multifocal pneumonia. Electronically Signed   By: Misty Stanley M.D.   On: 05/05/2020 10:46   CT HEAD WO CONTRAST  Result Date: 05/05/2020 CLINICAL DATA:  69 year old male with weakness, fatigue, productive cough, COVID-19. EXAM: CT HEAD WITHOUT CONTRAST TECHNIQUE: Contiguous axial images were obtained from the base of the skull through the vertex without intravenous contrast. COMPARISON:  None. FINDINGS: Brain: Cerebral volume is within normal limits for age. No midline shift, mass effect, or evidence of intracranial mass lesion. No ventriculomegaly. No acute intracranial hemorrhage identified. 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 Perivascular spaces less likely. Largely normal for age gray-white matter differentiation elsewhere. No acute cortical infarct or chronic cortical encephalomalacia identified. Vascular: Mild Calcified atherosclerosis at the skull base. No suspicious intracranial vascular hyperdensity. Skull: No acute osseous abnormality  identified. Sinuses/Orbits: Small fluid level with bubbly opacity in the right maxillary sinus. Minor ethmoid mucosal thickening. Other sinuses, the tympanic cavities and mastoids are well pneumatized. Other: No acute orbit or scalp soft tissue findings. Postoperative changes to the left globe. IMPRESSION: 1. No acute intracranial abnormality. 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 2. Acute right maxillary sinus inflammation. Electronically Signed   By: Genevie Ann M.D.   On: 05/05/2020 13:28   CT Angio Chest PE W and/or Wo Contrast  Result Date: 05/05/2020 CLINICAL DATA:  69 year old male with weakness, fatigue, productive cough. Treated for pneumonia in September with antibiotics. Subsequently diagnosed with COVID-19. EXAM: CT ANGIOGRAPHY CHEST WITH CONTRAST TECHNIQUE: Multidetector CT imaging of the chest was performed using the standard protocol during bolus administration of intravenous contrast. Multiplanar CT image reconstructions and MIPs were obtained to evaluate the vascular anatomy. CONTRAST:  75mL OMNIPAQUE IOHEXOL 350 MG/ML SOLN COMPARISON:  Chest radiographs earlier today. FINDINGS: Cardiovascular: Good contrast bolus timing in the pulmonary arterial tree. No focal filling defect identified in the pulmonary arteries to suggest acute pulmonary embolism. Cardiac size at the upper limits of normal.  Negative visible aorta. Mediastinum/Nodes: Negative; upper limits of normal right carina and subcarinal lymph nodes. Lungs/Pleura: Bilateral lower lobe consolidation involving the posterior basal segments. Additional streaky opacity in the other lower lobe segments. Peripheral and peribronchial mostly ground-glass opacity in both upper lobes. Major airways remain patent. No pleural effusion. Upper Abdomen: Negative  visible liver, gallbladder, spleen, pancreas, adrenal glands, kidneys, and bowel in the upper abdomen. Musculoskeletal: Degenerative vertebral endplate and  costovertebral spurring at multiple levels. No acute or suspicious osseous lesion. Review of the MIP images confirms the above findings. IMPRESSION: 1. Negative for acute pulmonary embolus. 2. Widespread bilateral pneumonia typical in appearance for COVID-19. Some consolidation in both lower lobes. No pleural effusion. Electronically Signed   By: Genevie Ann M.D.   On: 05/05/2020 13:25   US Venous Img Lower Bilateral (DVT)  Result Date: 05/05/2020 CLINICAL DATA:  Positive D-dimer study.  Recent fall EXAM: BILATERAL LOWER EXTREMITY VENOUS DUPLEX ULTRASOUND TECHNIQUE: Gray-scale sonography with graded compression, as well as color Doppler and duplex ultrasound were performed to evaluate the lower extremity deep venous systems from the level of the common femoral vein and including the common femoral, femoral, profunda femoral, popliteal and calf veins including the posterior tibial, peroneal and gastrocnemius veins when visible. The superficial great saphenous vein was also interrogated. Spectral Doppler was utilized to evaluate flow at rest and with distal augmentation maneuvers in the common femoral, femoral and popliteal veins. COMPARISON:  None. FINDINGS: RIGHT LOWER EXTREMITY Common Femoral Vein: No evidence of thrombus. Normal compressibility, respiratory phasicity and response to augmentation. Saphenofemoral Junction: No evidence of thrombus. Normal compressibility and flow on color Doppler imaging. Profunda Femoral Vein: No evidence of thrombus. Normal compressibility and flow on color Doppler imaging. Femoral Vein: No evidence of thrombus. Normal compressibility, respiratory phasicity and response to augmentation. Popliteal Vein: No evidence of thrombus. Normal compressibility, respiratory phasicity and response to augmentation. Calf Veins: No evidence of thrombus. Normal compressibility and flow on color Doppler imaging. Superficial Great Saphenous Vein: No evidence of thrombus. Normal compressibility.  Venous Reflux:  None. Other Findings:  Blood flow has a rouleaux type pattern. LEFT LOWER EXTREMITY Common Femoral Vein: No evidence of thrombus. Normal compressibility, respiratory phasicity and response to augmentation. Saphenofemoral Junction: No evidence of thrombus. Normal compressibility and flow on color Doppler imaging. Profunda Femoral Vein: No evidence of thrombus. Normal compressibility and flow on color Doppler imaging. Femoral Vein: No evidence of thrombus. Normal compressibility, respiratory phasicity and response to augmentation. Popliteal Vein: No evidence of thrombus. Normal compressibility, respiratory phasicity and response to augmentation. Calf Veins: No evidence of thrombus. Normal compressibility and flow on color Doppler imaging. Superficial Great Saphenous Vein: No evidence of thrombus. Normal compressibility. Venous Reflux:  None. Other Findings:  Blood flow as a rouleaux type pattern. IMPRESSION: No evidence of deep venous thrombosis in either lower extremity. Blood flow has a rouleaux type pattern bilaterally suggesting slow flow. Clinical significance of this finding uncertain. Electronically Signed   By: Lowella Grip III M.D.   On: 05/05/2020 14:44   Subjective: Breathing well, no chest pain. Walked with minimal dyspnea and no hypoxia.  Discharge Exam: Vitals:   05/11/20 0421 05/11/20 0737  BP: 119/82 113/78  Pulse: (!) 53 (!) 51  Resp: (!) 21 20  Temp: (!) 97.5 F (36.4 C) 98 F (36.7 C)  SpO2: 93% 95%   General: Pt is alert, awake, not in acute distress Cardiovascular: Regular bradycardia, S1/S2 +, no rubs, no gallops Respiratory: CTA bilaterally, no wheezing, no rhonchi Abdominal: Soft, NT, ND, bowel sounds + Extremities: No edema, no cyanosis. Right dorsal-lateral forearm distally with stellate clean laceration with sutures in place. Wound edges well-apposed but not healed over, can pull wound edges apart easily. No discharge/odor.   Labs: BNP (last 3  results) Recent Labs    05/05/20  1021  BNP 63.0   Basic Metabolic Panel: Recent Labs  Lab 05/06/20 0445 05/07/20 0518 05/08/20 0324 05/09/20 0503 05/10/20 0525  NA 138 135 135 136 136  K 5.0 4.5 4.3 4.3 4.5  CL 101 100 98 99 98  CO2 27 28 26 27 29   GLUCOSE 152* 171* 174* 185* 157*  BUN 27* 35* 35* 31* 31*  CREATININE 1.18 1.05 1.01 1.02 0.99  CALCIUM 8.9 8.5* 8.7* 8.7* 8.9  MG 2.7* 2.6* 2.5* 2.4 2.6*   Liver Function Tests: Recent Labs  Lab 05/06/20 0445 05/07/20 0518 05/08/20 0324 05/09/20 0503 05/10/20 0525  AST 37 36 29 22 24   ALT 38 45* 45* 41 44  ALKPHOS 47 49 48 48 50  BILITOT 0.9 0.9 0.7 0.7 0.8  PROT 6.8 6.6 6.3* 6.3* 6.2*  ALBUMIN 2.9* 2.8* 2.8* 3.0* 2.9*   No results for input(s): LIPASE, AMYLASE in the last 168 hours. No results for input(s): AMMONIA in the last 168 hours. CBC: Recent Labs  Lab 05/06/20 0445 05/07/20 0518 05/08/20 0324 05/09/20 0503 05/10/20 0525  WBC 5.8 12.4* 14.1* 11.6* 12.3*  NEUTROABS 5.0 11.1* 12.7* 9.8* 9.7*  HGB 15.1 14.6 14.3 15.0 15.6  HCT 44.3 43.1 42.6 44.7 45.6  MCV 91.3 90.7 90.8 91.0 90.1  PLT 211 256 284 289 353   Cardiac Enzymes: No results for input(s): CKTOTAL, CKMB, CKMBINDEX, TROPONINI in the last 168 hours. BNP: Invalid input(s): POCBNP CBG: No results for input(s): GLUCAP in the last 168 hours. D-Dimer No results for input(s): DDIMER in the last 72 hours. Hgb A1c No results for input(s): HGBA1C in the last 72 hours. Lipid Profile No results for input(s): CHOL, HDL, LDLCALC, TRIG, CHOLHDL, LDLDIRECT in the last 72 hours. Thyroid function studies No results for input(s): TSH, T4TOTAL, T3FREE, THYROIDAB in the last 72 hours.  Invalid input(s): FREET3 Anemia work up Recent Labs    05/09/20 0503 05/10/20 0525  FERRITIN 800* 719*   Urinalysis No results found for: COLORURINE, APPEARANCEUR, LABSPEC, [place], GLUCOSEU, [place], BILIRUBINUR, KETONESUR, PROTEINUR, UROBILINOGEN, NITRITE,  [place]  Microbiology Recent Results (from the past 240 hour(s))  Respiratory Panel by RT PCR (Flu A&B, Covid) - Nasopharyngeal Swab     Status: Abnormal   Collection Time: 05/05/20 11:09 AM   Specimen: Nasopharyngeal Swab  Result Value Ref Range Status   SARS Coronavirus 2 by RT PCR POSITIVE (A) NEGATIVE Final    Comment: RESULT CALLED TO, READ BACK BY AND VERIFIED WITH: KENDALL Ruddick 05/05/20 1208 KLW (NOTE) SARS-CoV-2 target nucleic acids are DETECTED.  SARS-CoV-2 RNA is generally detectable in upper respiratory specimens  during the acute phase of infection. Positive results are indicative of the presence of the identified virus, but do not rule out bacterial infection or co-infection with other pathogens not detected by the test. Clinical correlation with patient history and other diagnostic information is necessary to determine patient infection status. The expected result is Negative.  Fact Sheet for Patients:  PinkCheek.be  Fact Sheet for Healthcare Providers: GravelBags.it  This test is not yet approved or cleared by the [place] FDA and  has been authorized for detection and/or diagnosis of SARS-CoV-2 by FDA under an Emergency Use Authorization (EUA).  This EUA will remain in effect (meaning this test can be u sed) for the duration of  the COVID-19 declaration under Section 564(b)(1) of the Act, 21 U.S.C. section 360bbb-3(b)(1), unless the authorization is terminated or revoked sooner.      Influenza A by PCR NEGATIVE  NEGATIVE Final   Influenza B by PCR NEGATIVE NEGATIVE Final    Comment: (NOTE) The Xpert Xpress SARS-CoV-2/FLU/RSV assay is intended as an aid in  the diagnosis of influenza from Nasopharyngeal swab specimens and  should not be used as a sole basis for treatment. Nasal washings and  aspirates are unacceptable for Xpert Xpress SARS-CoV-2/FLU/RSV  testing.  Fact Sheet for  Patients: PinkCheek.be  Fact Sheet for Healthcare Providers: GravelBags.it  This test is not yet approved or cleared by the [place] FDA and  has been authorized for detection and/or diagnosis of SARS-CoV-2 by  FDA under an Emergency Use Authorization (EUA). This EUA will remain  in effect (meaning this test can be used) for the duration of the  Covid-19 declaration under Section 564(b)(1) of the Act, 21  U.S.C. section 360bbb-3(b)(1), unless the authorization is  terminated or revoked. Performed at [facility], [address] [place], [place] [address]   Culture, blood (Routine X 2) w Reflex to ID Panel     Status: None   Collection Time: 05/05/20  2:23 PM   Specimen: BLOOD  Result Value Ref Range Status   Specimen Description BLOOD RIGHT ASSIST CONTROL  Final   Special Requests   Final    BOTTLES DRAWN AEROBIC AND ANAEROBIC Blood Culture adequate volume   Culture   Final    NO GROWTH 5 DAYS Performed at [facility], [address] [place], [place] [address]    Report Status 05/10/2020 FINAL  Final  Culture, blood (Routine X 2) w Reflex to ID Panel     Status: None   Collection Time: 05/05/20  2:30 PM   Specimen: BLOOD  Result Value Ref Range Status   Specimen Description BLOOD LEFT ASSIST CONTROL  Final   Special Requests   Final    BOTTLES DRAWN AEROBIC AND ANAEROBIC Blood Culture adequate volume   Culture   Final    NO GROWTH 5 DAYS Performed at [facility], [address] [place], [place] [address]    Report Status 05/10/2020 FINAL  Final    Time coordinating discharge: Approximately 40 minutes  Patrecia Pour, MD  [facility] 05/11/2020, 11:05 AM

## 2020-05-11 NOTE — Progress Notes (Signed)
Pt was able to ambulate in the room without oxygen. Oxygen saturation remained at 92% as pt performed deep breathing while ambulating. Pt oxygen was reapplied and titrated down to 1L after ambulating roughly 45ft in the room as he was walking back and forth from the bathroom door to the window as instructed by this nurse. Pt did not exhibit any signs of respiratory distress during the assessment. Pt remained on 1l of oxygen via nasal canula throughout the shift and was able to maintain an oxygen saturation of 92% and above. Pt is stable at this time

## 2020-05-11 NOTE — Progress Notes (Signed)
SATURATION QUALIFICATIONS: (This note is used to comply with regulatory documentation for home oxygen)  Patient Saturations on Room Air at Rest = 96%  Patient Saturations on Room Air while Ambulating = 90%  Please briefly explain why patient needs home oxygen:  Patient able to ambulate 160 feet on RA and maintained O2 sats between 90-94%.  He did not c/o SOB or dyspnea or weakness or dizziness.  No s/s of respiratory distress noted on ambulation.

## 2020-05-13 DIAGNOSIS — U071 COVID-19: Secondary | ICD-10-CM | POA: Diagnosis not present

## 2020-05-13 DIAGNOSIS — J1282 Pneumonia due to coronavirus disease 2019: Secondary | ICD-10-CM | POA: Diagnosis not present

## 2020-05-13 DIAGNOSIS — G250 Essential tremor: Secondary | ICD-10-CM | POA: Diagnosis not present

## 2020-05-18 DIAGNOSIS — U071 COVID-19: Secondary | ICD-10-CM | POA: Diagnosis not present

## 2020-05-18 DIAGNOSIS — J1282 Pneumonia due to coronavirus disease 2019: Secondary | ICD-10-CM | POA: Diagnosis not present

## 2020-05-18 DIAGNOSIS — G250 Essential tremor: Secondary | ICD-10-CM | POA: Diagnosis not present

## 2020-05-20 DIAGNOSIS — Y92031 Bathroom in apartment as the place of occurrence of the external cause: Secondary | ICD-10-CM | POA: Diagnosis not present

## 2020-05-20 DIAGNOSIS — R55 Syncope and collapse: Secondary | ICD-10-CM | POA: Diagnosis not present

## 2020-05-20 DIAGNOSIS — U099 Post covid-19 condition, unspecified: Secondary | ICD-10-CM | POA: Diagnosis not present

## 2020-05-20 DIAGNOSIS — R059 Cough, unspecified: Secondary | ICD-10-CM | POA: Diagnosis not present

## 2020-05-20 DIAGNOSIS — R0602 Shortness of breath: Secondary | ICD-10-CM | POA: Diagnosis not present

## 2020-05-20 DIAGNOSIS — R251 Tremor, unspecified: Secondary | ICD-10-CM | POA: Diagnosis not present

## 2020-05-20 DIAGNOSIS — W1811XA Fall from or off toilet without subsequent striking against object, initial encounter: Secondary | ICD-10-CM | POA: Diagnosis not present

## 2020-05-20 DIAGNOSIS — S51812A Laceration without foreign body of left forearm, initial encounter: Secondary | ICD-10-CM | POA: Diagnosis not present

## 2020-05-20 DIAGNOSIS — R432 Parageusia: Secondary | ICD-10-CM | POA: Diagnosis not present

## 2020-05-24 DIAGNOSIS — G250 Essential tremor: Secondary | ICD-10-CM | POA: Diagnosis not present

## 2020-05-24 DIAGNOSIS — J1282 Pneumonia due to coronavirus disease 2019: Secondary | ICD-10-CM | POA: Diagnosis not present

## 2020-05-24 DIAGNOSIS — U071 COVID-19: Secondary | ICD-10-CM | POA: Diagnosis not present

## 2020-05-26 DIAGNOSIS — Z4802 Encounter for removal of sutures: Secondary | ICD-10-CM | POA: Diagnosis not present

## 2020-05-26 DIAGNOSIS — R059 Cough, unspecified: Secondary | ICD-10-CM | POA: Diagnosis not present

## 2020-05-26 DIAGNOSIS — S51812D Laceration without foreign body of left forearm, subsequent encounter: Secondary | ICD-10-CM | POA: Diagnosis not present

## 2020-06-03 DIAGNOSIS — J1282 Pneumonia due to coronavirus disease 2019: Secondary | ICD-10-CM | POA: Diagnosis not present

## 2020-06-03 DIAGNOSIS — G250 Essential tremor: Secondary | ICD-10-CM | POA: Diagnosis not present

## 2020-06-03 DIAGNOSIS — U071 COVID-19: Secondary | ICD-10-CM | POA: Diagnosis not present

## 2022-05-03 IMAGING — CR DG CHEST 2V
1 series · 2 of 2 positions shown · non-contrast
Comparison: [facility]OSPITAL] x-ray from 04/25/2020.

CLINICAL DATA: Productive cough.

EXAM:
CHEST - 2 VIEW

[Series 1: dg chest 2 view · 0.14mm/px · 2 of 2 slices shown]
[im 1/2]
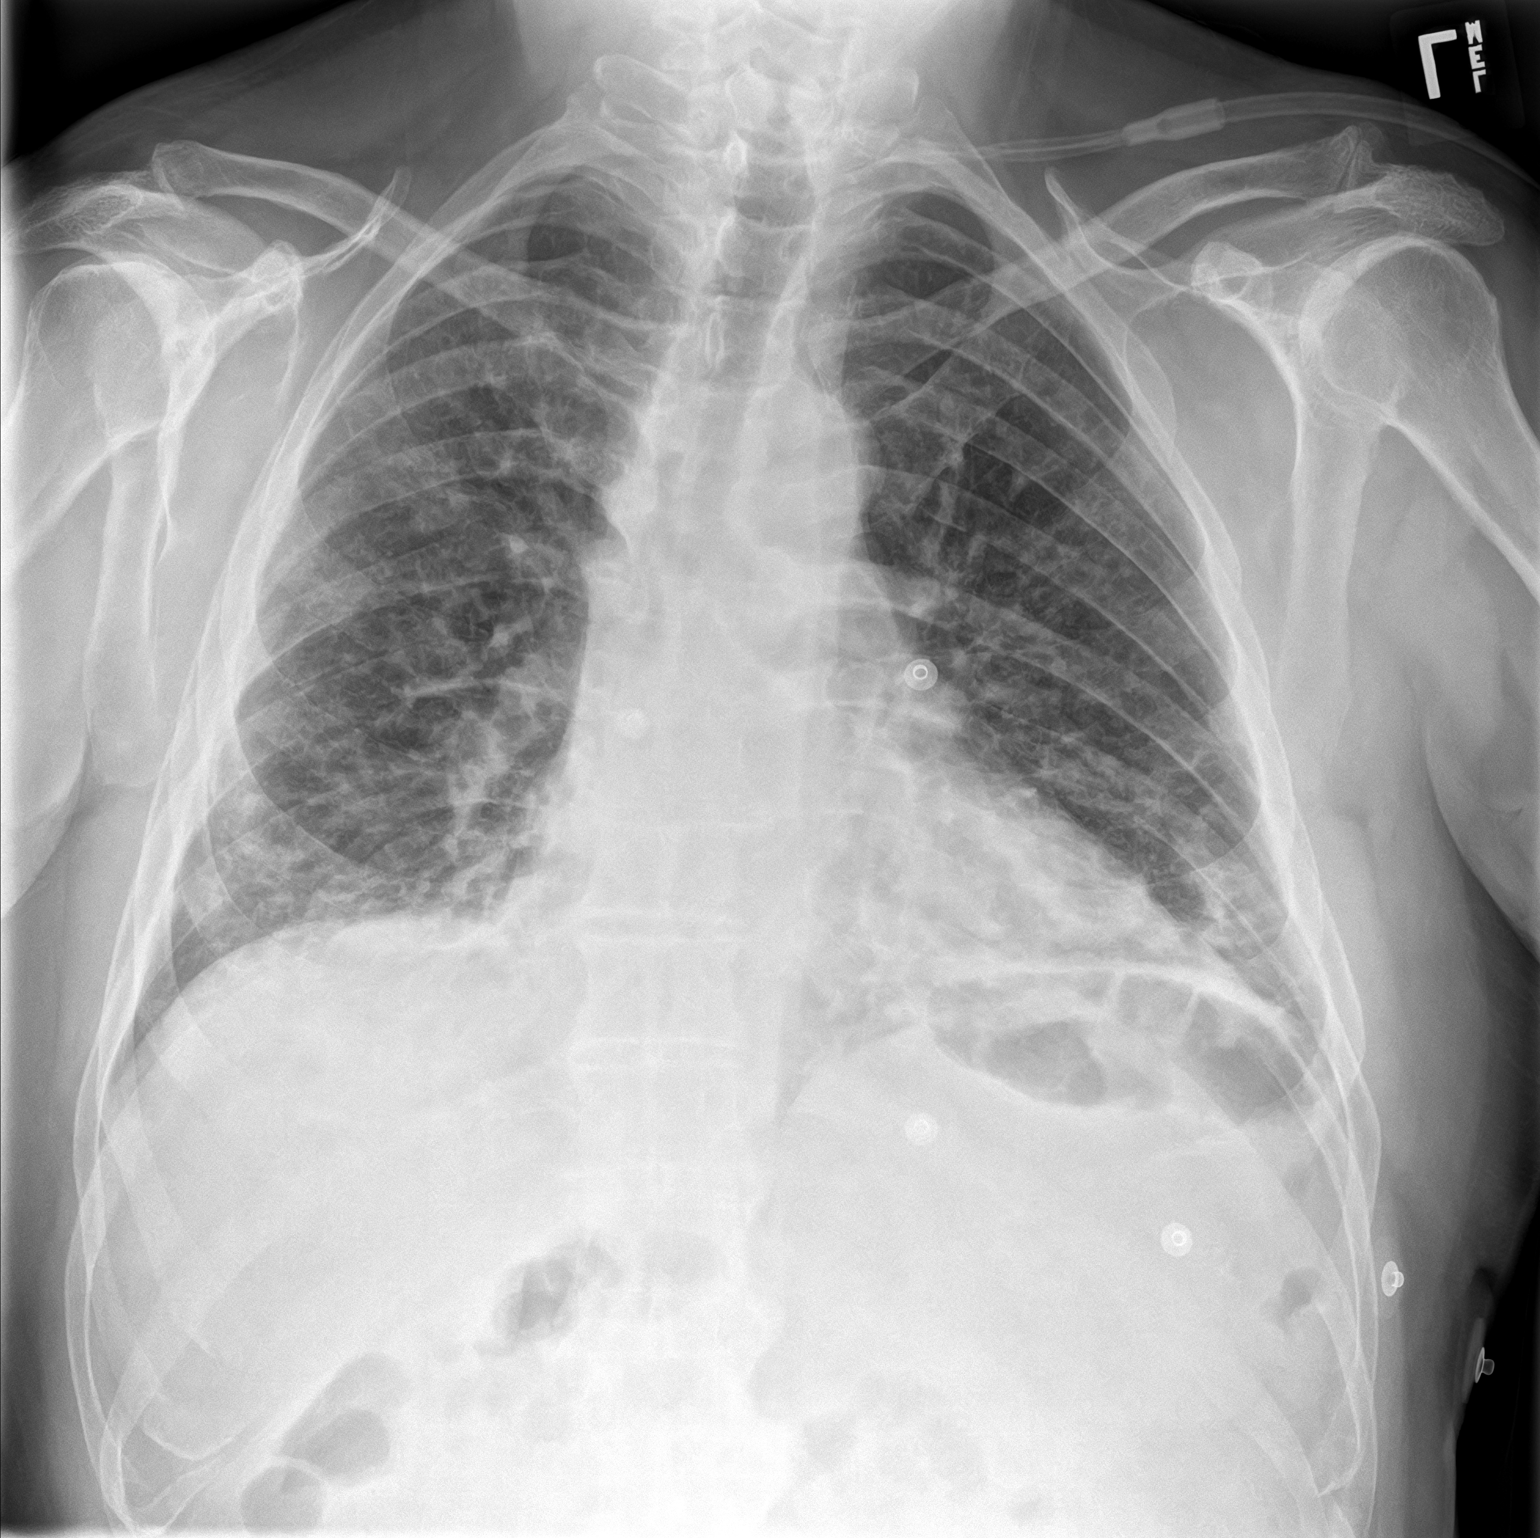
[im 2/2]
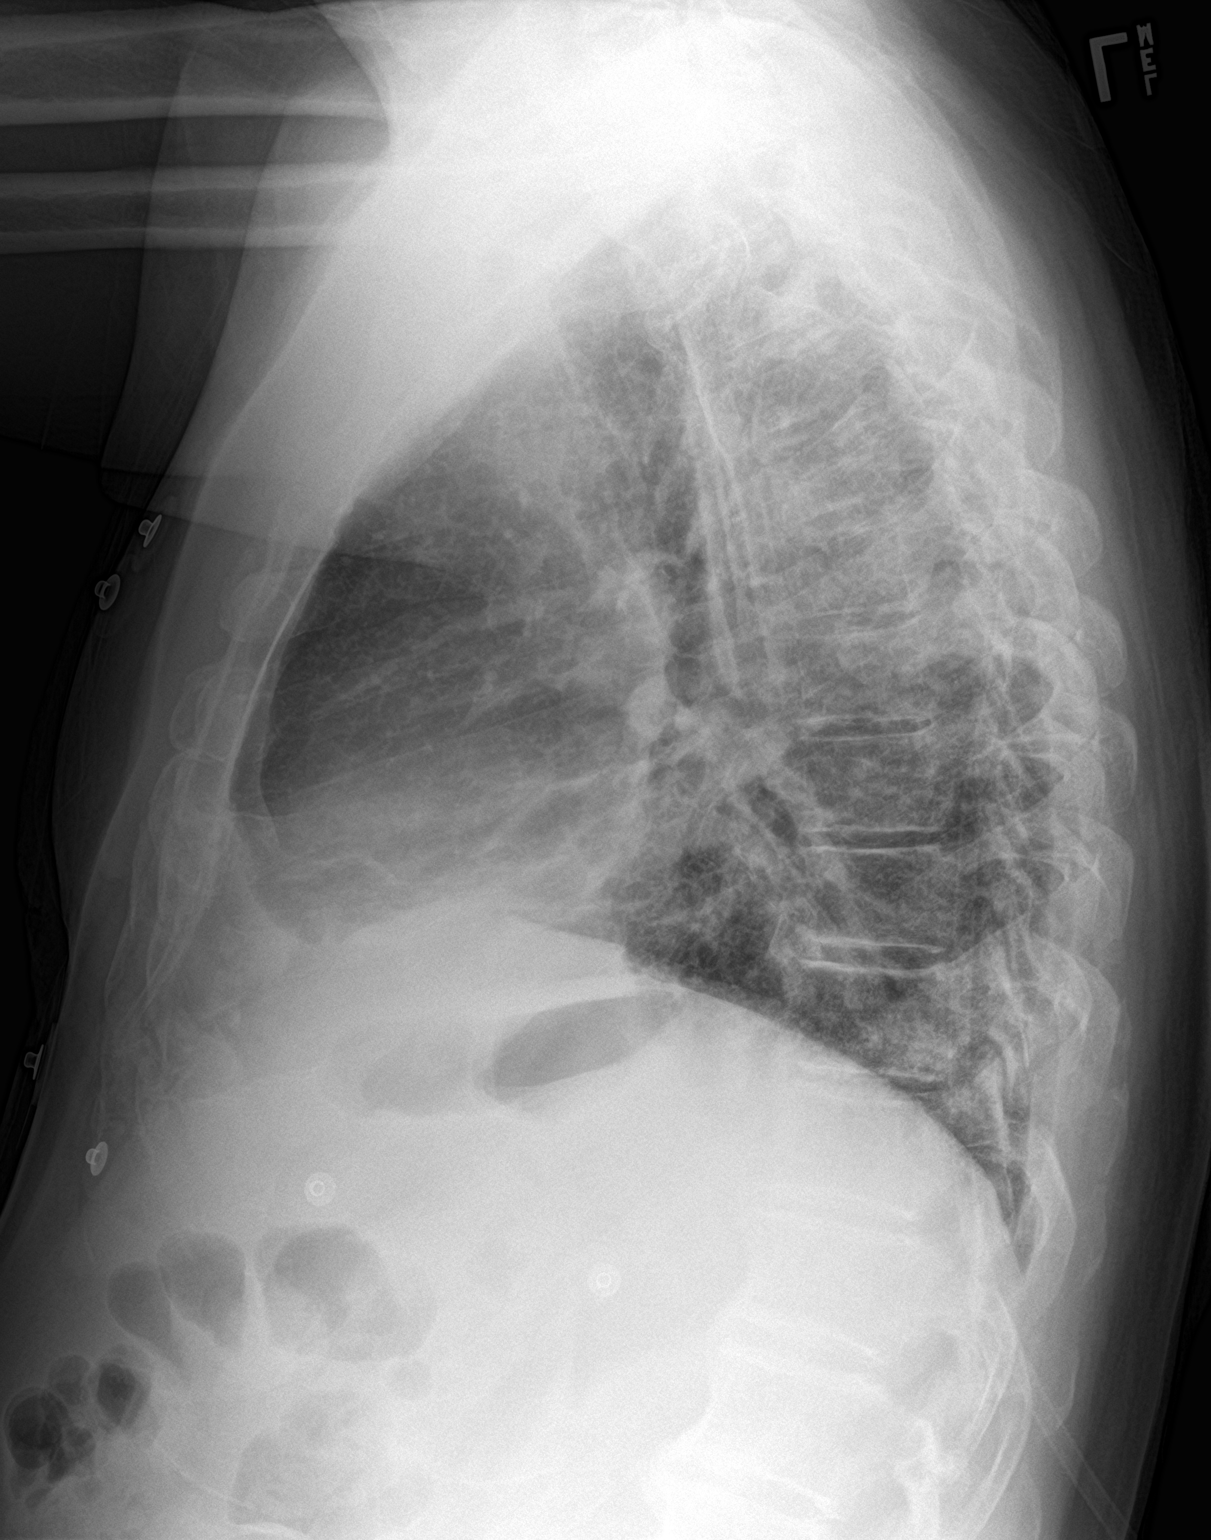

[2 of 2 positions shown; findings below may reference images not displayed]

FINDINGS: Interval development of patchy basilar predominant airspace disease,
left greater than right. No pleural effusion. Cardiopericardial
silhouette is at upper limits of normal for size. The visualized
bony structures of the thorax show no acute abnormality.
IMPRESSION: Patchy bilateral basilar predominant airspace disease compatible
with multifocal pneumonia.

## 2022-05-03 IMAGING — CT CT HEAD W/O CM
3 series · 15 of 47 positions shown, 18 images · non-contrast
Comparison: None.

CLINICAL DATA: 69-year-old male with weakness, fatigue, productive
cough, [id number].

EXAM:
CT HEAD WITHOUT CONTRAST
TECHNIQUE: Contiguous axial images were obtained from the base of the skull
through the vertex without intravenous contrast.

[Series 3: head wo · axial · 0.44mm/px · z∈[-162,-37]mm · 9 of 31 slices shown, 12 images]
[im 3/31  brain]
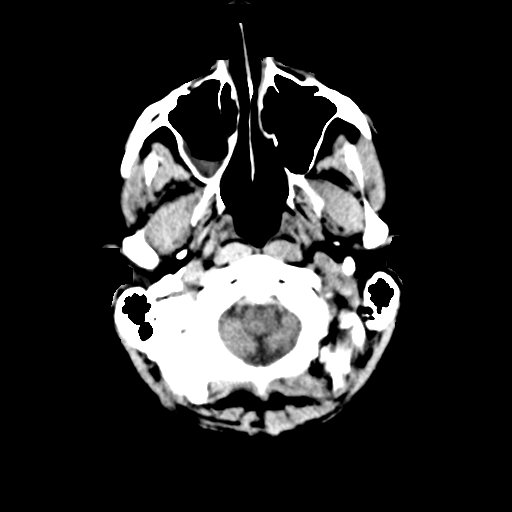
[im 3/31  bone]
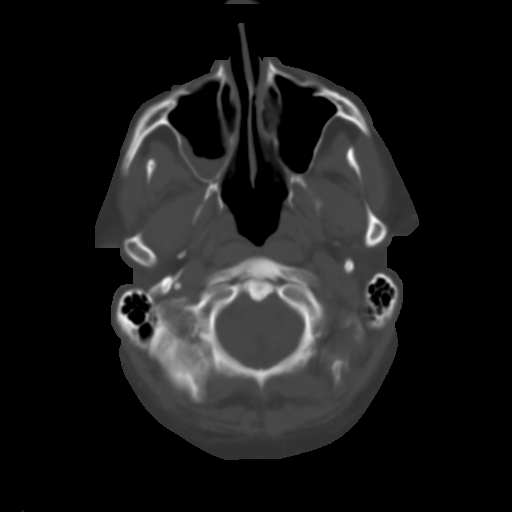
[im 6/31  brain]
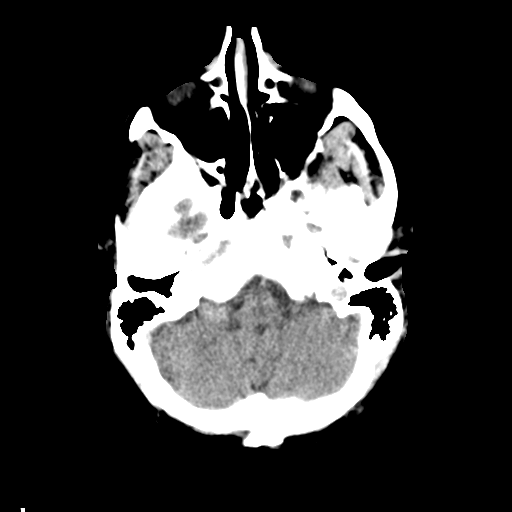
[im 9/31  brain]
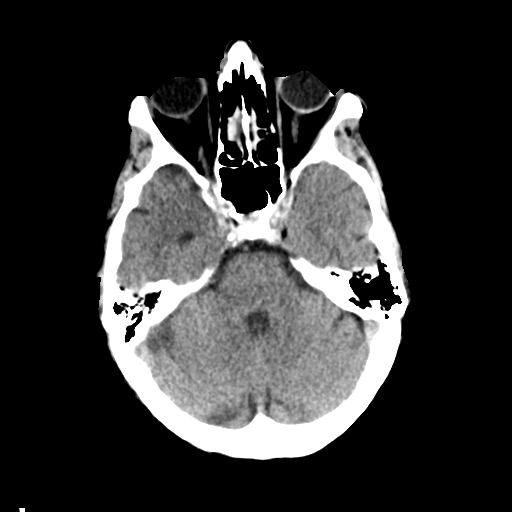
[im 12/31  brain]
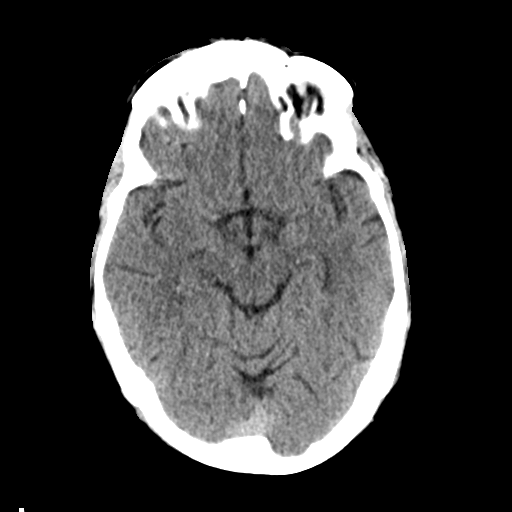
[im 16/31  brain]
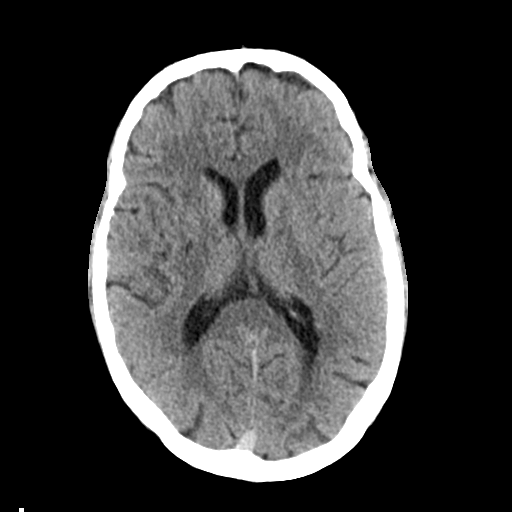
[im 16/31  bone]
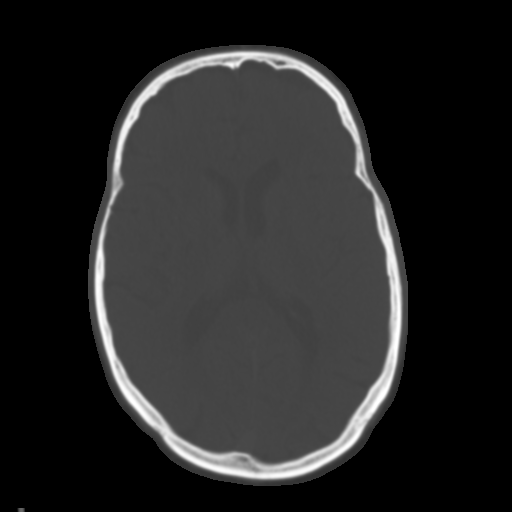
[im 19/31  brain]
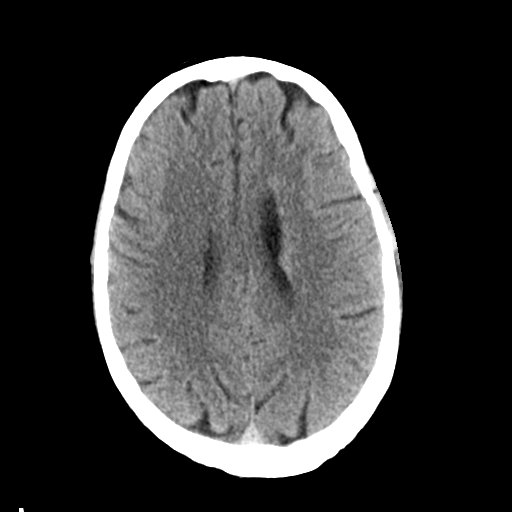
[im 22/31  brain]
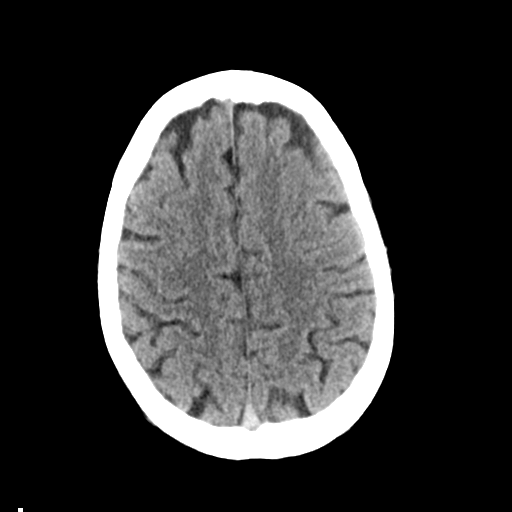
[im 25/31  brain]
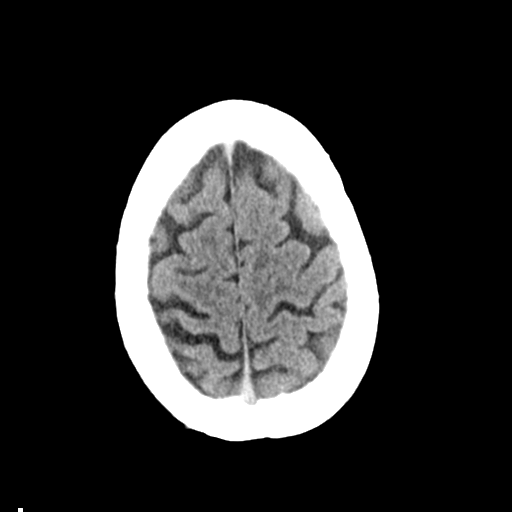
[im 28/31  brain]
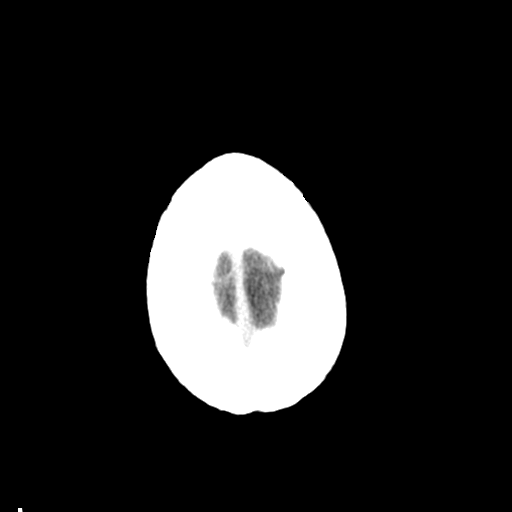
[im 28/31  bone]
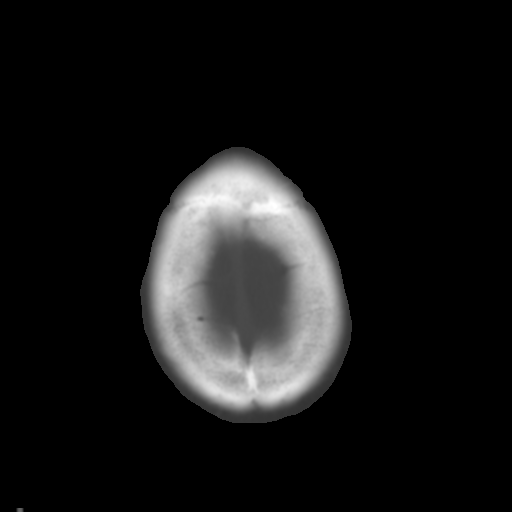

[Series 4: coronal soft tissue · coronal · 0.34mm/px · 3 of 66 slices shown]
[im 22/66  brain]
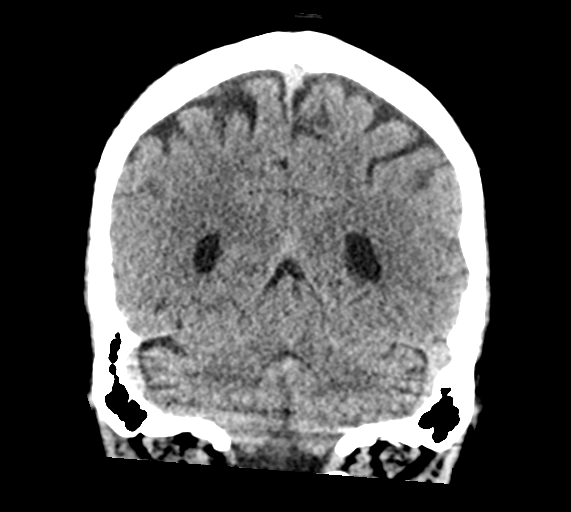
[im 29/66  brain]
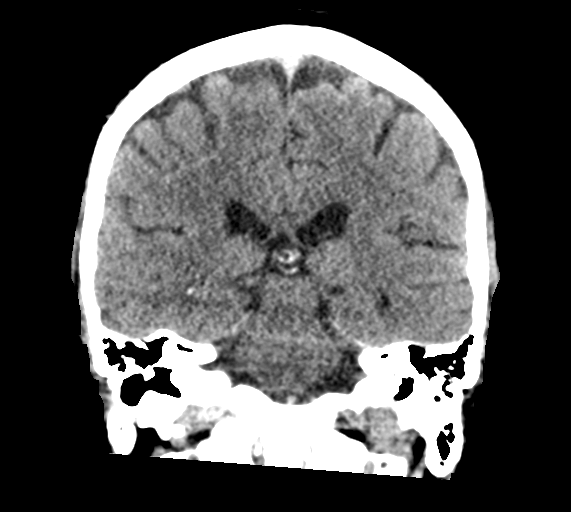
[im 37/66  brain]
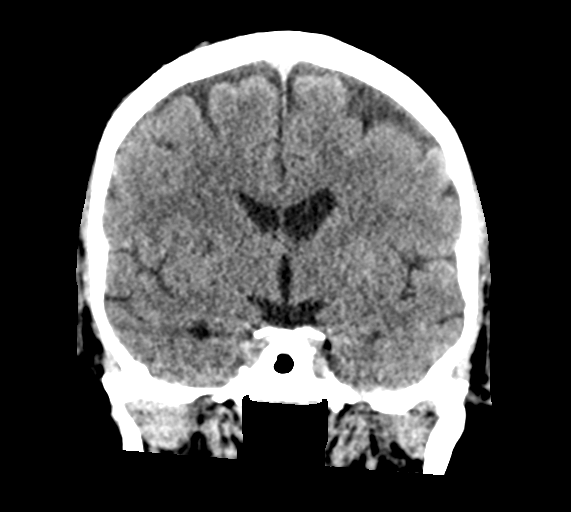

[Series 5: sagittal soft tissue · sagittal · 0.35mm/px · 3 of 51 slices shown]
[im 17/51  brain]
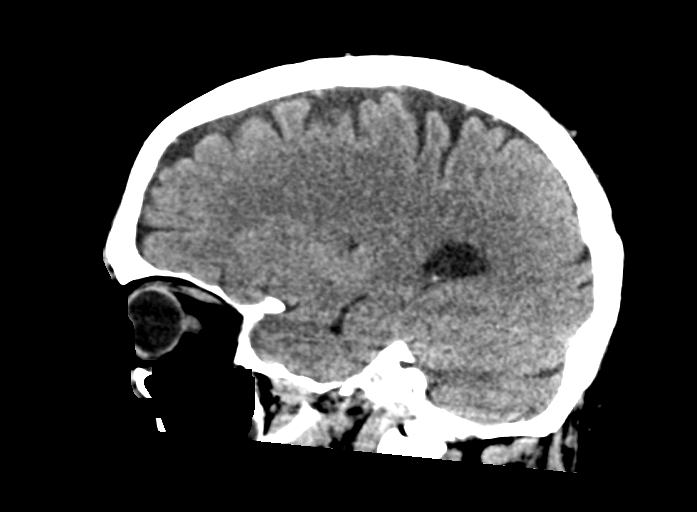
[im 26/51  brain]
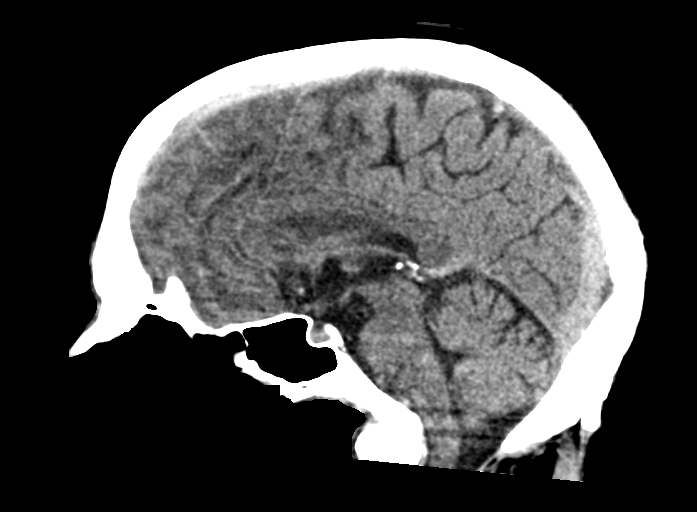
[im 34/51  brain]
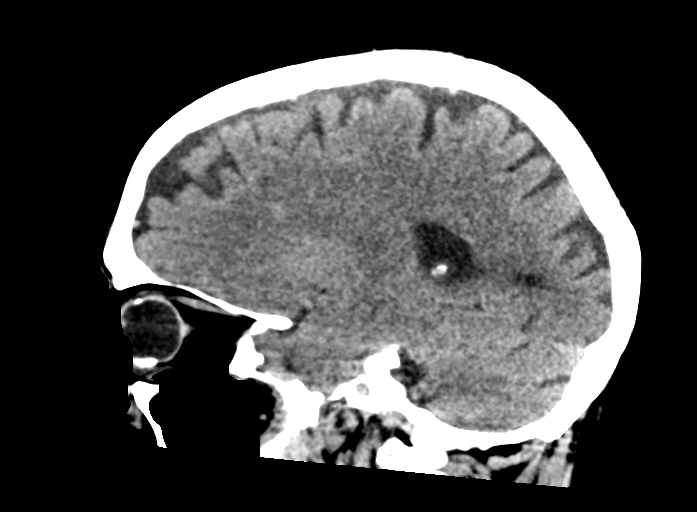

[15 of 47 positions shown; findings below may reference images not displayed]

FINDINGS: Brain: Cerebral volume is within normal limits for age. No midline
shift, mass effect, or evidence of intracranial mass lesion. No
ventriculomegaly. No acute intracranial hemorrhage identified.

Probable small chronic lacunar infarct of the right lentiform
nuclei. Perivascular spaces less likely. Largely normal for age
gray-white matter differentiation elsewhere. No acute cortical
infarct or chronic cortical encephalomalacia identified.

Vascular: Mild Calcified atherosclerosis at the skull base. No
suspicious intracranial vascular hyperdensity.

Skull: No acute osseous abnormality identified.

Sinuses/Orbits: Small fluid level with bubbly opacity in the right
maxillary sinus. Minor ethmoid mucosal thickening. Other sinuses,
the tympanic cavities and mastoids are well pneumatized.

Other: No acute orbit or scalp soft tissue findings. Postoperative
changes to the left globe.
IMPRESSION: 1. No acute intracranial abnormality.
Probable small chronic lacunar infarct of the right lentiform
nuclei.

2. Acute right maxillary sinus inflammation.

## 2022-05-03 IMAGING — US US EXTREM LOW VENOUS
1 series · 13 of 24 positions shown · non-contrast
Comparison: None.

CLINICAL DATA: Positive D-dimer study.  Recent fall

EXAM:
BILATERAL LOWER EXTREMITY VENOUS DUPLEX ULTRASOUND
TECHNIQUE: Gray-scale sonography with graded compression, as well as color
Doppler and duplex ultrasound were performed to evaluate the lower
extremity deep venous systems from the level of the common femoral
vein and including the common femoral, femoral, profunda femoral,
popliteal and calf veins including the posterior tibial, peroneal
and gastrocnemius veins when visible. The superficial great
saphenous vein was also interrogated. Spectral Doppler was utilized
to evaluate flow at rest and with distal augmentation maneuvers in
the common femoral, femoral and popliteal veins.

[Series 1: us extrem low venous · 0.08mm/px · 61 acquisitions, 13 frames shown]
[im 1/61]
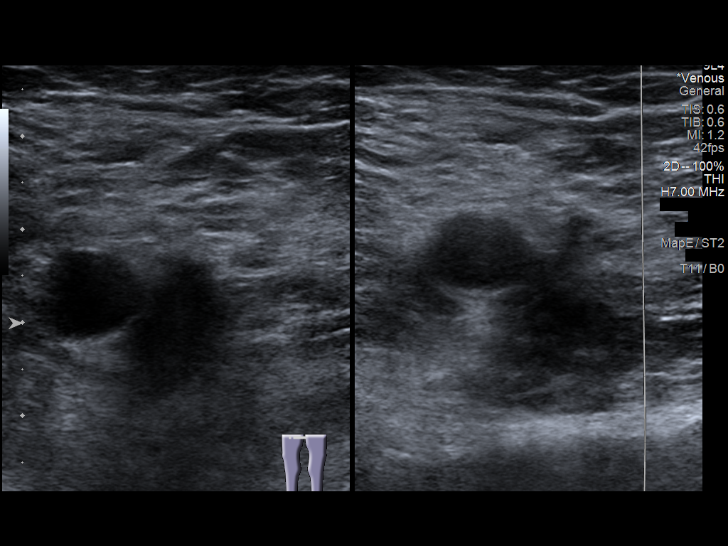
[im 6/61]
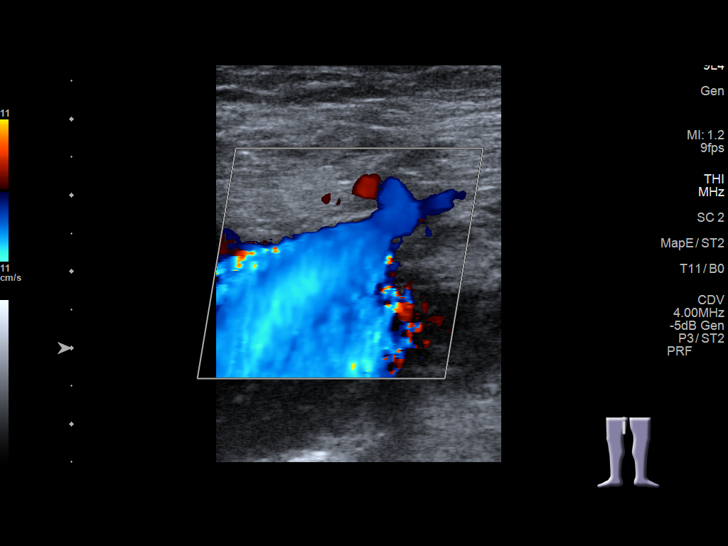
[im 11/61]
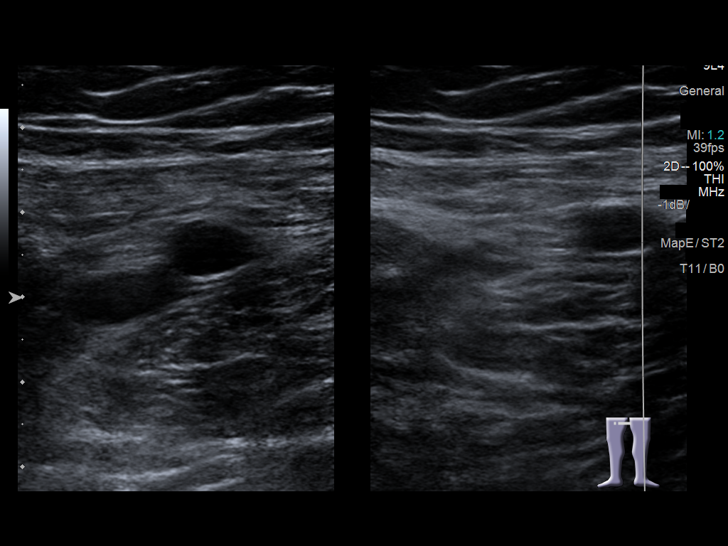
[im 16/61]
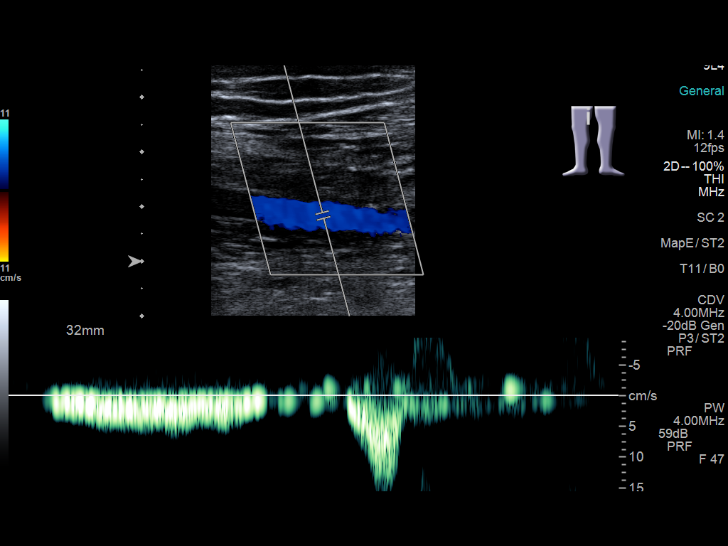
[im 21/61]
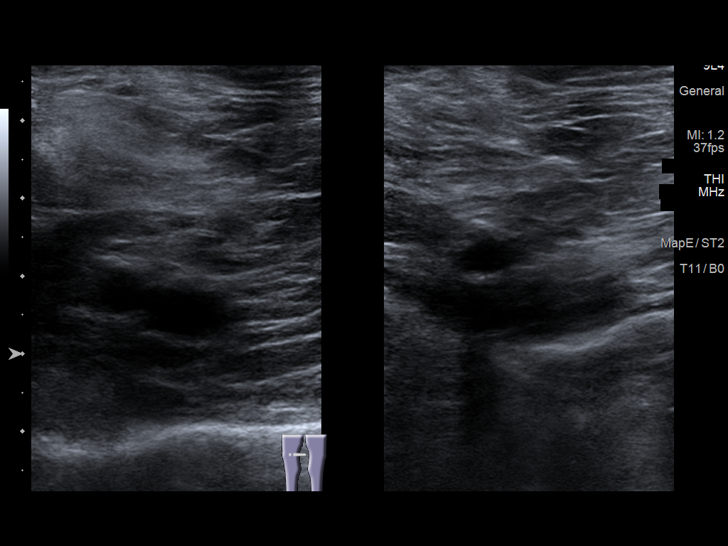
[im 27/61]
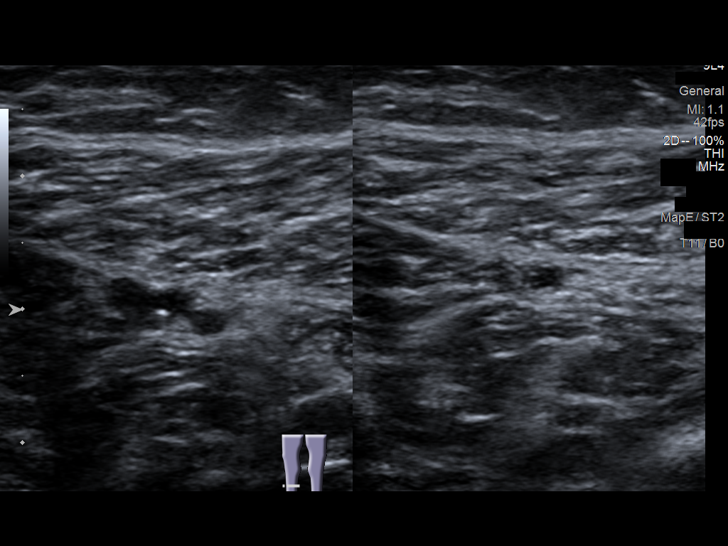
[im 34/61]
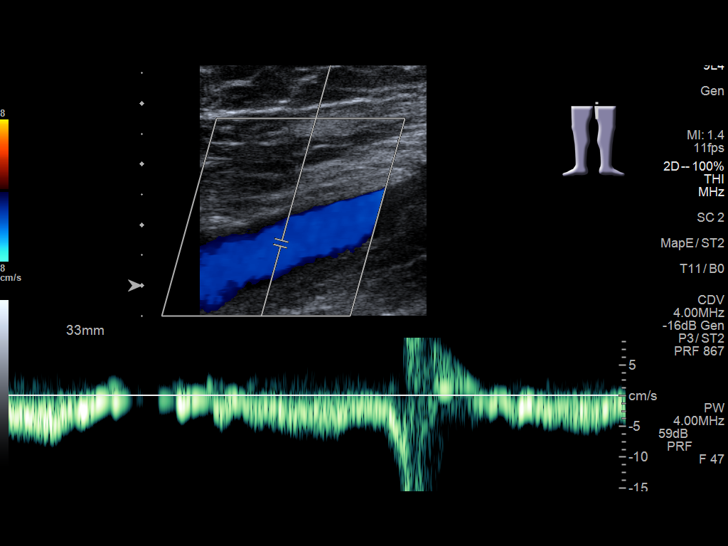
[im 37/61]
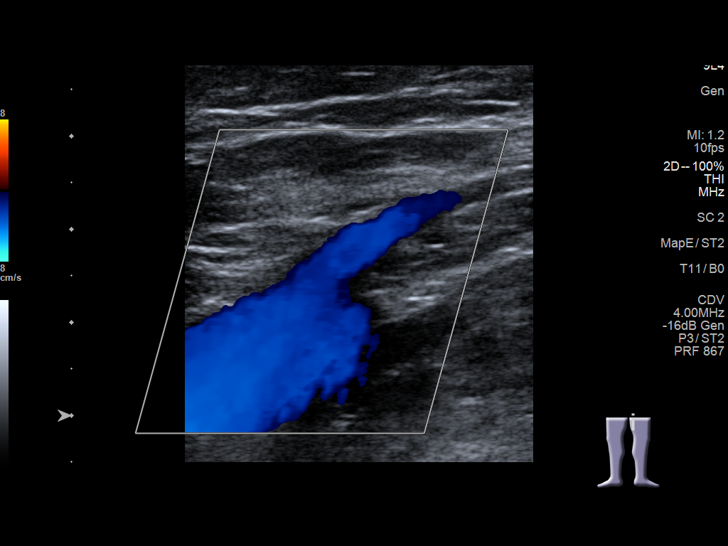
[im 42/61]
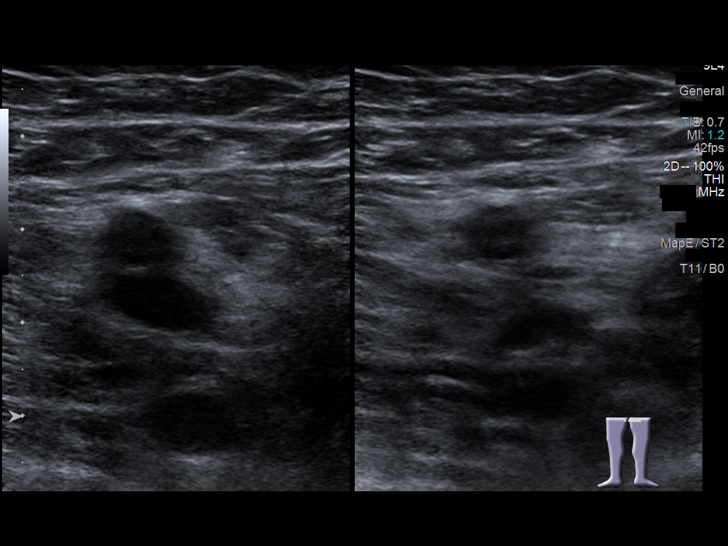
[im 47/61]
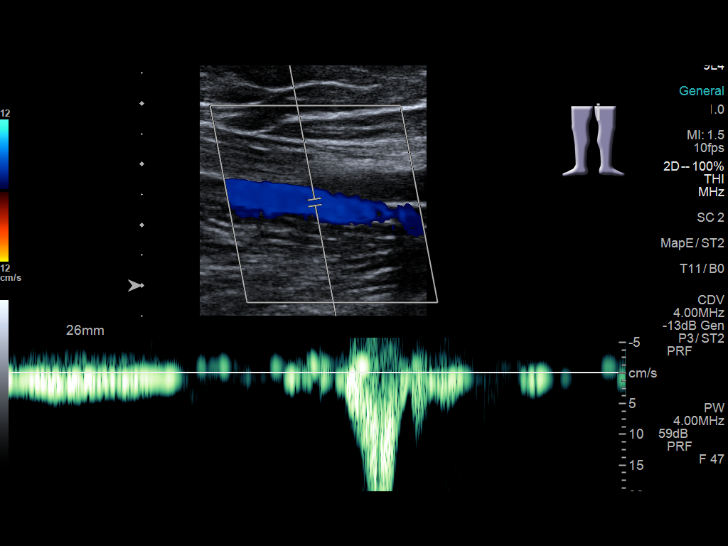
[im 50/61]
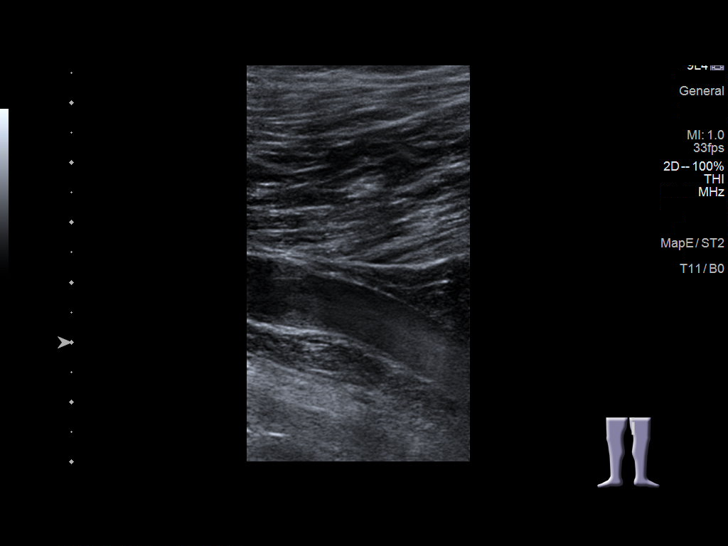
[im 55/61]
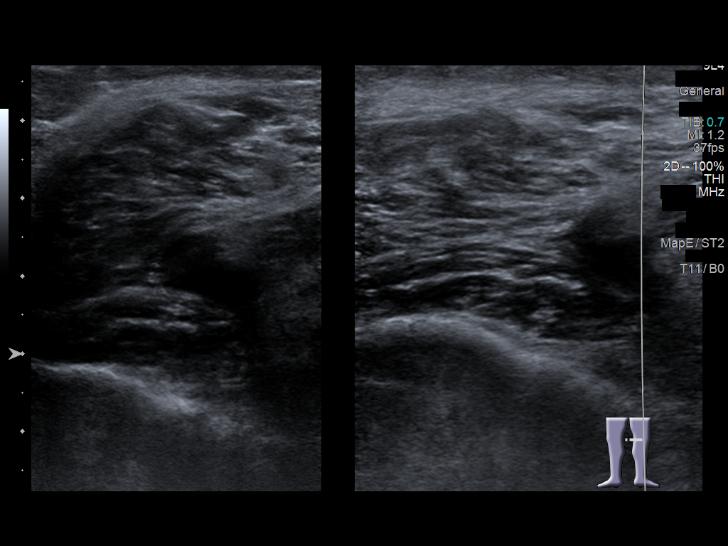
[im 61/61]
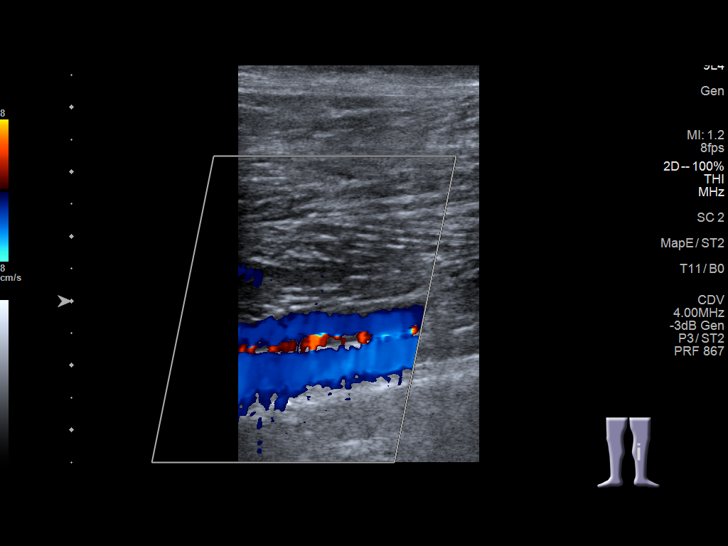

[13 of 24 positions shown; findings below may reference images not displayed]

FINDINGS: RIGHT LOWER EXTREMITY

Common Femoral Vein: No evidence of thrombus. Normal
compressibility, respiratory phasicity and response to augmentation.

Saphenofemoral Junction: No evidence of thrombus. Normal
compressibility and flow on color Doppler imaging.

Profunda Femoral Vein: No evidence of thrombus. Normal
compressibility and flow on color Doppler imaging.

Femoral Vein: No evidence of thrombus. Normal compressibility,
respiratory phasicity and response to augmentation.

Popliteal Vein: No evidence of thrombus. Normal compressibility,
respiratory phasicity and response to augmentation.

Calf Veins: No evidence of thrombus. Normal compressibility and flow
on color Doppler imaging.

Superficial Great Saphenous Vein: No evidence of thrombus. Normal
compressibility.

Venous Reflux:  None.

Other Findings:  Blood flow has a rouleaux type pattern.

LEFT LOWER EXTREMITY

Common Femoral Vein: No evidence of thrombus. Normal
compressibility, respiratory phasicity and response to augmentation.

Saphenofemoral Junction: No evidence of thrombus. Normal
compressibility and flow on color Doppler imaging.

Profunda Femoral Vein: No evidence of thrombus. Normal
compressibility and flow on color Doppler imaging.

Femoral Vein: No evidence of thrombus. Normal compressibility,
respiratory phasicity and response to augmentation.

Popliteal Vein: No evidence of thrombus. Normal compressibility,
respiratory phasicity and response to augmentation.

Calf Veins: No evidence of thrombus. Normal compressibility and flow
on color Doppler imaging.

Superficial Great Saphenous Vein: No evidence of thrombus. Normal
compressibility.

Venous Reflux:  None.

Other Findings:  Blood flow as a rouleaux type pattern.
IMPRESSION: No evidence of deep venous thrombosis in either lower extremity.
Blood flow has a rouleaux type pattern bilaterally suggesting slow
flow. Clinical significance of this finding uncertain.
